# Patient Record
Sex: Female | Born: 1985 | Race: White | Hispanic: Yes | Marital: Married | Smoking: Never smoker
Health system: Southern US, Community
[De-identification: ages and names within clinical notes are randomized; demographics above are authoritative.]

## PROBLEM LIST (undated history)

## (undated) DIAGNOSIS — B999 Unspecified infectious disease: Secondary | ICD-10-CM

## (undated) DIAGNOSIS — A159 Respiratory tuberculosis unspecified: Secondary | ICD-10-CM

## (undated) HISTORY — PX: NO PAST SURGERIES: SHX2092

---

## 2013-09-15 NOTE — L&D Delivery Note (Signed)
Delivery Note At 6:18 PM a viable and healthy female was delivered via Vaginal, Spontaneous Delivery (Presentation:cephalic;left occipital anterior).  APGAR:9,9; weight - pending .   Placenta status:intact; shultz.  Cord: 3 vessel with the following complications: none.    Anesthesia: none   Episiotomy: none  Lacerations: none  Suture Repair: N/A Est. Blood Loss (mL): 200 Mom and baby doing well, bonding FOB at bedside Mom to postpartum.  Baby to Couplet care / Skin to Skin.  ADAMS,SHNIQUAL SHWON 08/18/2014, 6:39 PM  I have seen and examined this patient and I agree with the above. Cam HaiSHAW, KIMBERLY CNM 11:04 PM 08/18/2014

## 2014-02-08 ENCOUNTER — Encounter (HOSPITAL_COMMUNITY): Payer: Self-pay | Admitting: Emergency Medicine

## 2014-02-08 ENCOUNTER — Emergency Department (HOSPITAL_COMMUNITY)
Admission: EM | Admit: 2014-02-08 | Discharge: 2014-02-08 | Disposition: A | Discharge status: Home / Self Care | Payer: Medicaid Other | Attending: Emergency Medicine | Admitting: Emergency Medicine

## 2014-02-08 DIAGNOSIS — A499 Bacterial infection, unspecified: Secondary | ICD-10-CM | POA: Insufficient documentation

## 2014-02-08 DIAGNOSIS — O2390 Unspecified genitourinary tract infection in pregnancy, unspecified trimester: Secondary | ICD-10-CM | POA: Insufficient documentation

## 2014-02-08 DIAGNOSIS — B9689 Other specified bacterial agents as the cause of diseases classified elsewhere: Secondary | ICD-10-CM | POA: Insufficient documentation

## 2014-02-08 DIAGNOSIS — N760 Acute vaginitis: Secondary | ICD-10-CM | POA: Insufficient documentation

## 2014-02-08 DIAGNOSIS — O200 Threatened abortion: Secondary | ICD-10-CM

## 2014-02-08 LAB — CBC WITH DIFFERENTIAL/PLATELET
Basophils Absolute: 0 10*3/uL (ref 0.0–0.1)
Basophils Relative: 0 % (ref 0–1)
Eosinophils Absolute: 0.3 10*3/uL (ref 0.0–0.7)
Eosinophils Relative: 3 % (ref 0–5)
HCT: 34.3 % — ABNORMAL LOW (ref 36.0–46.0)
HEMOGLOBIN: 11.5 g/dL — AB (ref 12.0–15.0)
LYMPHS ABS: 2.3 10*3/uL (ref 0.7–4.0)
LYMPHS PCT: 25 % (ref 12–46)
MCH: 28.3 pg (ref 26.0–34.0)
MCHC: 33.5 g/dL (ref 30.0–36.0)
MCV: 84.5 fL (ref 78.0–100.0)
MONOS PCT: 6 % (ref 3–12)
Monocytes Absolute: 0.6 10*3/uL (ref 0.1–1.0)
NEUTROS ABS: 6.2 10*3/uL (ref 1.7–7.7)
NEUTROS PCT: 66 % (ref 43–77)
Platelets: 273 10*3/uL (ref 150–400)
RBC: 4.06 MIL/uL (ref 3.87–5.11)
RDW: 14.4 % (ref 11.5–15.5)
WBC: 9.4 10*3/uL (ref 4.0–10.5)

## 2014-02-08 LAB — COMPREHENSIVE METABOLIC PANEL
ALK PHOS: 58 U/L (ref 39–117)
ALT: 18 U/L (ref 0–35)
AST: 13 U/L (ref 0–37)
Albumin: 3.6 g/dL (ref 3.5–5.2)
BUN: 12 mg/dL (ref 6–23)
CO2: 23 meq/L (ref 19–32)
Calcium: 9.5 mg/dL (ref 8.4–10.5)
Chloride: 100 mEq/L (ref 96–112)
Creatinine, Ser: 0.56 mg/dL (ref 0.50–1.10)
GFR calc Af Amer: >90 mL/min (ref >90)
GFR calc non Af Amer: >90 mL/min (ref >90)
GLUCOSE: 99 mg/dL (ref 70–99)
POTASSIUM: 3.9 meq/L (ref 3.7–5.3)
Sodium: 138 mEq/L (ref 137–147)
Total Bilirubin: <0.2 mg/dL — ABNORMAL LOW (ref 0.3–1.2)
Total Protein: 7.5 g/dL (ref 6.0–8.3)

## 2014-02-08 LAB — ABO/RH: ABO/RH(D): A POS

## 2014-02-08 LAB — WET PREP, GENITAL
Trich, Wet Prep: NONE SEEN
Yeast Wet Prep HPF POC: NONE SEEN

## 2014-02-08 LAB — HCG, QUANTITATIVE, PREGNANCY: HCG, BETA CHAIN, QUANT, S: 53456 m[IU]/mL — AB (ref <5)

## 2014-02-08 MED ORDER — METRONIDAZOLE 500 MG PO TABS
500.0000 mg | ORAL_TABLET | Freq: Once | ORAL | Status: AC
  Administered 2014-02-08: 500 mg via ORAL
  Filled 2014-02-08: qty 1

## 2014-02-08 MED ORDER — LIDOCAINE HCL (PF) 1 % IJ SOLN
INTRAMUSCULAR | Status: AC
  Administered 2014-02-08: 5 mL
  Filled 2014-02-08: qty 5

## 2014-02-08 MED ORDER — METRONIDAZOLE 500 MG PO TABS: 500.0000 mg | ORAL_TABLET | Freq: Two times a day (BID) | ORAL | Status: DC

## 2014-02-08 MED ORDER — AZITHROMYCIN 250 MG PO TABS
1000.0000 mg | ORAL_TABLET | Freq: Once | ORAL | Status: AC
  Administered 2014-02-08: 1000 mg via ORAL
  Filled 2014-02-08: qty 4

## 2014-02-08 MED ORDER — CEFTRIAXONE SODIUM 250 MG IJ SOLR
250.0000 mg | Freq: Once | INTRAMUSCULAR | Status: AC
  Administered 2014-02-08: 250 mg via INTRAMUSCULAR
  Filled 2014-02-08: qty 250

## 2014-02-08 NOTE — ED Provider Notes (Signed)
Rh positive will not need RhoGAM will be discharged home per Zigmund Gottron -previous instructions  Arman Filter, NP 02/08/14 2139

## 2014-02-08 NOTE — ED Notes (Signed)
Pt reports that she is about [redacted] weeks pregnant and yesterday noticed some bright red blood. Reports pain around her hips and lower abd. Reports some nausea.

## 2014-02-08 NOTE — ED Notes (Signed)
Shelia from the lab called and notified to cancel UA

## 2014-02-08 NOTE — ED Notes (Signed)
Patient G3P2

## 2014-02-08 NOTE — Discharge Instructions (Signed)
Please follow up with your OBGYN or [facility] for further management of your pregnancy. Take antibiotic as prescribed for the full duration.  Return to ER if you have any concerns. Follow instruction below.    Amenaza de aborto (Threatened Miscarriage) La hemorragia en las primeras 20 semanas de embarazo es algo frecuente. Se denomina amenaza de aborto Es un problema del embarazo que ocurre antes de la vigsima semana y que sugiere la probabilidad de que ocurra un aborto espontneo. Generalmente esta hemorragia se detiene con reposo o con disminucin de las actividades, segn le ha sugerido el profesional que la [place], y Firefighter contina sin [employer]. Le indicarn que no [employer], no tenga orgasmos ni use tampones hasta que la autoricen. En algunos casos la amenaza de aborto progresar hasta el aborto completo o incompleto. En algunos casos puede ser necesario un tratamiento adicional, en otros casos no. Algunos abortos ocurren antes que la mujer note que no ha [employer] y de que sepa que est embarazada. Un aborto ocurre en el 15% al 20% de todos los embarazos y generalmente durante las primeras 13 semanas. En la [employer] se desconoce la causa exacta. Es Biochemist, clinical en que la naturaliza pone fin a un embarazo anormal o que no llegar a trmino. Algunas de las cosas que ponen en riesgo el embarazo son:  Los cambios hormonales.  Infeccin o tumores en el tero.  Enfermedades crnicas, por ejemplo la diabetes, especialmente si no se ha controlado.  Forma anormal del tero.  Fibromas en el tero  Crvix incompetente (es demasiado dbil para contener al beb)  El consumo de cigarrillos.  Beber alcohol en exceso. Lo mejor es abstenerse de beber alcohol durante el embarazo.  El consumo de drogas TRATAMIENTO No es necesario Education officer, environmental un tratamiento adicional cuando el aborto es completo y todos los componentes de la concepcin (todos los  tejidos) se han eliminado. Si ha eliminado tejidos, consrvelos en un recipiente y llvelos al mdico para que los evale. Si el aborto es incompleto (partes del feto o de la placenta [place] [employer]ung) ser necesario un tratamiento adicional. La razn ms frecuente para Education officer, environmental un tratamiento es el sangrado (hemorragia) continuo debido a que los tejidos no se han eliminado completamente. Esto sucede cuando el aborto es incompleto. Tambin puede [employer] tejidos que no se han expulsado se infecten. El tratamiento consiste en la dilatacin y Scientific laboratory technician (remocin de los productos del embarazo que pudieran quedar en el tero). Se realizar simplemente por medio de la succin (curetaje por succin) o por un raspado simple del interior del tero. Podr llevarse a cabo en el hospital o en el consultorio del profesional. Slo se lleva a cabo cuando el profesional se asegura que no hay posibilidades de que el embarazo llegue a trmino. Esto se determina por medio del examen fsico, la prueba de Psychiatrist negativa, el recuento hormonal y el ultrasonido que confirme la muerte del feto. El aborto generalmente es una situacin emocionalmente difcil para los [place]. No es por su culpa ni la de su pareja. No ocurre por conductas inapropiadas por parte suya o de su compaero. Casi todos los abortos se producen porque el embarazo ha comenzado de un modo incorrecto. Al menos la mitad de estos embarazos presenta anormalidades cromosmicas. Casi nunca se trata de un trastorno congnito. En otros puede haber problemas de desarrollo en el feto o en la placenta. Esto no siempre se revela, an cuando se estudien los productos  del aborto con el microscopio. No se sienta culpable y probablemente no podra haber evitado que esto ocurra. Si tiene Delta Air Linesproblemas emocionales debido a este problema, convrselo con el profesional y solicite ayuda psicolgico antes de un nuevo Psychiatristembarazo. Casi siempre puede tratar de embarazarse nuevamente tan pronto  el profesional la autorice. INSTRUCCIONES PARA EL CUIDADO DOMICILIARIO  El Economistprofesional le indicar reposo, segn la importancia de la hemorragia y los dolores que Malcolmpresente. Probablemente slo la autorice a levantarse para ir al bao. Tambin podr autorizarla a Building surveyorrealizar una actividad ligera. En este momento usted Economistnecesitar hacer algunos arreglos para que otra persona se ocupe del cuidado de los nios y de otras responsabilidades adicionales.  Lleve un registro de la cantidad y la saturacin de las toallas higinicas que Landscape architectutiliza cada da. Anote esta informacin.  NO USE TAMPONES. No se haga duchas vaginales ni tenga relaciones sexuales u orgasmos hasta que el mdico la autorice.  Puede ser que le indiquen una cita para un seguimiento en el que volvern a Development worker, communityevaluar el [place]estado de su Psychiatristembarazo y Chief Executive Officerle repetirn las pruebas de Norrissangre. Concurra para una nueva evaluacin dentro de 2 das y Richardmouthnuevamente dentro de 4 a 6 semanas. Es muy importante que realice el seguimiento en el momento que le han indicado.  Si su grupo sanguneo es Rh negativo y el del padre es Rh positivo, o si no conoce el grupo sanguneo del padre, le indicarn una inyeccin (inmunoglobulina Rh) para prevenir los anticuerpos anormales que pueden desarrollarse y Audiological scientistafectar al beb en futuros embarazos. SOLICITE ATENCIN MDICA DE INMEDIATO SI:  Siente calambres intensos en el estmago, en la espalda o en el abdomen.  Siente un dolor intenso de comienzo sbito en la zona inferior del abdomen.  Comienza a sentir escalofros.  La temperatura se eleva por encima de 101 F (38.3 C).  Elimina cogulos o tejidos grandes. Guarde una muestra de esos tejidos para que el profesional lo inspeccione.  La hemorragia aumenta o se siente mareada, dbil o tiene episodios de desmayos.  Tiene una prdida de lquido por la vagina.  Se desmaya. No puede mover el intestino. Podra tratarse de un embarazo ectpico. Document Released: 06/11/2005 Document  Revised: 11/24/2011 [facility] Patient Information 2014 [place], [place].  Vaginosis bacteriana (Bacterial Vaginosis) La vaginosis bacteriana es una infeccin vaginal que perturba el equilibrio normal de las bacterias que se encuentran en la vagina. Es el resultado de un crecimiento excesivo de ciertas bacterias. Esta es la infeccin vaginal ms frecuente en mujeres en edad reproductiva. El tratamiento es importante para prevenir complicaciones, especialmente en mujeres embarazadas, dado que puede causar un parto prematuro. CAUSAS  La vaginosis bacteriana se origina por un aumento de bacterias nocivas que, generalmente, estn presentes en cantidades ms pequeas en la vagina. Varios tipos diferentes de bacterias pueden causar esta afeccin. Sin embargo, la causa de su desarrollo no se comprende totalmente. FACTORES DE RIESGO Ciertas actividades o comportamientos pueden exponerlo a un mayor riesgo de desarrollar vaginosis bacteriana, entre los que se incluyen:  Tener una nueva pareja sexual o mltiples parejas sexuales.  Las duchas vaginales  El uso del DIU (dispositivo intrauterino) como mtodo anticonceptivo. El contagio no se produce en baos, por ropas de cama, en piscinas o por contacto con objetos. SIGNOS Y SNTOMAS  Algunas mujeres que padecen vaginosis bacteriana no presentan signos ni sntomas. Los sntomas ms comunes son:  Secrecin vaginal de color grisceo.  Secrecin vaginal con olor similar al Wal-Martpescado, especialmente despus de Sales promotion account executivemantener relaciones sexuales.  Picazn o  sensacin de ardor en la vagina o la vulva.  Ardor o dolor al [employer]illips. DIAGNSTICO  Su mdico analizar su historia clnica y le examinar la vagina para detectar signos de vaginosis bacteriana. Puede tomarle Lauris Poag de flujo vaginal. Su mdico examinar esta muestra con un microscopio para controlar las bacterias y clulas anormales. Tambin puede realizarse un anlisis del pH vaginal.  TRATAMIENTO  La  vaginosis bacteriana puede tratarse con antibiticos, en forma de comprimidos o de crema vaginal. Puede indicarse una segunda tanda de antibiticos si la afeccin se repite despus del tratamiento.  INSTRUCCIONES PARA EL CUIDADO EN EL HOGAR   Tome solo medicamentos de venta libre o recetados, segn las indicaciones del mdico.  Si le han recetado antibiticos, tmelos como se le indic. Asegrese de que finaliza la prescripcin completa aunque se sienta mejor.  No mantenga relaciones sexuales Librarian, academic.  Comunique a sus compaeros sexuales que sufre una infeccin vaginal. Deben consultar a su mdico y recibir tratamiento si tienen problemas, como picazn o una erupcin cutnea leve.  Practique el sexo seguro usando preservativos y tenga un nico compaero sexual. SOLICITE ATENCIN MDICA SI:   Sus sntomas no mejoran despus de 3 das de [place].  Aumenta la secrecin o Chief Technology Officer.  Tiene fiebre. ASEGRESE DE QUE:   Comprende estas instrucciones.  Controlar su afeccin.  Recibir ayuda de inmediato si no mejora o si empeora. PARA OBTENER MS INFORMACIN  Centros para el control y la prevencin de Child psychotherapist for Disease Control and Prevention, CDC): SolutionApps.co.za Asociacin Estadounidense de la Salud Sexual (American Sexual Health Association, SHA): www.ashastd.org  Document Released: 12/09/2007 Document Revised: 06/22/2013 [facility] Patient Information 2014 [place], [place].

## 2014-02-08 NOTE — ED Provider Notes (Signed)
CSN: [id number]     Arrival date & time 02/08/14  1711 History   First MD Initiated Contact with Patient 02/08/14 1852     Chief Complaint  Patient presents with  . Abdominal Pain     (Consider location/radiation/quality/duration/timing/severity/associated sxs/prior Treatment) HPI  28 year old G3 P2 female who reportedly [redacted] weeks pregnant presents complaining of vaginal bleeding. Patient report yesterday she developed gradual onset of low pelvic pain. She had a bowel movement and subsequently noticed mild vaginal bleeding when wiped. Patient continues to endorse stabbing low abdominal pain throughout today and states she hasn't notice any fetal movement. No vaginal bleeding today.  She reports mild nausea only. Denies any fever, chills, chest pain, shortness of breath, back pain, dysuria, hematuria, vaginal discharge, or rash. She denies any recent trauma. She has 2 normal vaginal birth with healthy kids. Her last menstrual period was March 16. She has not had an official ultrasound has been seen by an OB/GYN yet   History reviewed. No pertinent past medical history. History reviewed. No pertinent past surgical history. History reviewed. No pertinent family history. History  Substance Use Topics  . Smoking status: Never Smoker   . Smokeless tobacco: Not on file  . Alcohol Use: No   OB History   Grav Para Term Preterm Abortions TAB SAB Ect Mult Living   1              Review of Systems  Constitutional: Negative for fever.  Gastrointestinal: Positive for abdominal pain.  Genitourinary: Positive for vaginal bleeding. Negative for flank pain.  All other systems reviewed and are negative.     Allergies  Review of patient's allergies indicates no known allergies.  Home Medications   Prior to Admission medications   Not on File   BP 126/68  Pulse 69  Temp(Src) 98.8 F (37.1 C) (Oral)  Resp 20  SpO2 99%  LMP 11/28/2013 Physical Exam  Nursing note and vitals  reviewed. Constitutional: She appears well-developed and well-nourished. No distress.  HENT:  Head: Normocephalic and atraumatic.  Eyes: Conjunctivae are normal.  Neck: Normal range of motion. Neck supple.  Cardiovascular: Normal rate and regular rhythm.   Pulmonary/Chest: Effort normal and breath sounds normal. She exhibits no tenderness.  Abdominal: Soft. There is no tenderness (mild suprapubic tenderness without guarding or rebound tenderness.).  Genitourinary: Uterus normal. There is no rash or lesion on the right labia. There is no rash or lesion on the left labia. Cervix exhibits no motion tenderness and no discharge. Right adnexum displays no mass. Left adnexum displays no mass. There is bleeding around the vagina. No erythema or tenderness around the vagina. No vaginal discharge found.  Chaperone present:  Patient has normal external genitalia, no evidence of lymphadenopathy. Normal labia majora, normal labia minora, vaginal vault without active bleeding or discharge. Cervical os visualized and is closed. 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 Minimal tenderness to both left and right adnexal without cervical motion tenderness.  Lymphadenopathy:       Right: No inguinal adenopathy present.       Left: No inguinal adenopathy present.    ED Course  Procedures (including critical care time)  Pt here with vaginal bleeding, currently [redacted] weeks pregnant.  On pelvic exam pt has friable tissue at cervical os but os is closed.  Bleeding likely from these friable tissue.  On bedside US performed by me and Dr. Silverio Lay we  can visualized an IUP with normal fetal heart activity.  Given her bleeding which is concerning for threatening miscarriage, [facility] or with her [facility]BGYN for further evaluation.  I will also check ABO/Rh to determine if she needs RhoGam.  Pt does have moderate clue cells and strong odor with  vaginal discharge on exam, i will give flagyl as treatment.    Pt does have many WBC on wet prep, will await GC/Ch cultures to determine treatment.  Care discussed with oncoming provider who will d/c pt pending lab results.    Labs Review Labs Reviewed  WET PREP, GENITAL - Abnormal; Notable for the following:    Clue Cells Wet Prep HPF POC MODERATE (*)    WBC, Wet Prep HPF POC MANY (*)    All other components within normal limits  CBC WITH DIFFERENTIAL - Abnormal; Notable for the following:    Hemoglobin 11.5 (*)    HCT 34.3 (*)    All other components within normal limits  COMPREHENSIVE METABOLIC PANEL - Abnormal; Notable for the following:    Total Bilirubin <0.2 (*)    All other components within normal limits  HCG, QUANTITATIVE, PREGNANCY - Abnormal; Notable for the following:    hCG, Beta Chain, Quant, S 53456 (*)    All other components within normal limits  GC/CHLAMYDIA PROBE AMP  HIV ANTIBODY (ROUTINE TESTING)  ABO/RH    Imaging Review No results found.   EKG Interpretation None      MDM   Final diagnoses:  Threatened miscarriage in early pregnancy  BV (bacterial vaginosis)    BP 108/67  Pulse 71  Temp(Src) 97.8 F (36.6 C) (Oral)  Resp 16  SpO2 99%  LMP 11/28/2013     Fayrene HelperBowie Candies Palm, PA-C 02/09/14 1058

## 2014-02-08 NOTE — ED Notes (Signed)
Phlebotomy at bedside.

## 2014-02-09 LAB — GC/CHLAMYDIA PROBE AMP
CT PROBE, AMP APTIMA: NEGATIVE
GC Probe RNA: NEGATIVE

## 2014-02-09 LAB — HIV ANTIBODY (ROUTINE TESTING W REFLEX): HIV 1&2 Ab, 4th Generation: NONREACTIVE

## 2014-02-09 NOTE — ED Provider Notes (Signed)
Medical screening examination/treatment/procedure(s) were conducted as a shared visit with non-physician practitioner(s) and myself.  I personally evaluated the patient during the encounter.   EKG Interpretation None      See my separate note   Richardean Canal, MD 02/09/14 [phone]

## 2014-02-09 NOTE — ED Provider Notes (Signed)
Medical screening examination/treatment/procedure(s) were conducted as a shared visit with non-physician practitioner(s) and myself.  I personally evaluated the patient during the encounter.   EKG Interpretation None      Hannah [name] is a 28 y.o. female [redacted] weeks pregnant by dates here with vaginal bleeding. Lower pelvic pain and some spotting. PA performed vaginal exam showed small spotting. I performed US that showed nl IUP, rate 180s, 12 weeks by date. Labs unremarkable. Recommend [facility]. Likely threatened abortion.   EMERGENCY DEPARTMENT [place] PREGNANCY "Study: Limited Ultrasound of the Pelvis"  INDICATIONS:Pregnancy(required) and Vaginal bleeding Multiple views of the uterus and pelvic cavity are obtained with a multi-frequency probe.  APPROACH:Transabdominal   PERFORMED BY: Myself  IMAGES ARCHIVED?: Yes  LIMITATIONS: Emergent procedure  PREGNANCY FREE FLUID: None  PREGNANCY UTERUS FINDINGS:Uterus enlarged and Gestational sac noted ADNEXAL FINDINGS:Left ovary not seen and Right ovary not seen  PREGNANCY FINDINGS: Intrauterine gestational sac noted, Yolk sac noted, Fetal pole present and Fetal heart activity seen  INTERPRETATION: Viable intrauterine pregnancy  GESTATIONAL AGE, ESTIMATE: 12 weeks  FETAL HEART RATE: 188  COMMENT(Estimate of Gestational Age):  Nl first trimester pregnancy       Richardean Canal, MD 02/09/14 1500

## 2014-02-16 LAB — OB RESULTS CONSOLE GBS: STREP GROUP B AG: POSITIVE

## 2014-02-20 ENCOUNTER — Other Ambulatory Visit (HOSPITAL_COMMUNITY): Payer: Self-pay | Admitting: Physician Assistant

## 2014-02-20 DIAGNOSIS — O3680X0 Pregnancy with inconclusive fetal viability, not applicable or unspecified: Secondary | ICD-10-CM

## 2014-02-20 DIAGNOSIS — Z3689 Encounter for other specified antenatal screening: Secondary | ICD-10-CM

## 2014-02-20 LAB — OB RESULTS CONSOLE HIV ANTIBODY (ROUTINE TESTING): HIV: NONREACTIVE

## 2014-02-20 LAB — OB RESULTS CONSOLE GC/CHLAMYDIA
Chlamydia: NEGATIVE
Gonorrhea: NEGATIVE

## 2014-02-20 LAB — OB RESULTS CONSOLE RPR: RPR: NONREACTIVE

## 2014-02-20 LAB — OB RESULTS CONSOLE RUBELLA ANTIBODY, IGM: RUBELLA: IMMUNE

## 2014-02-20 LAB — OB RESULTS CONSOLE HEPATITIS B SURFACE ANTIGEN: Hepatitis B Surface Ag: NEGATIVE

## 2014-02-21 ENCOUNTER — Ambulatory Visit (HOSPITAL_COMMUNITY)
Admission: RE | Admit: 2014-02-21 | Discharge: 2014-02-21 | Disposition: A | Discharge status: Home / Self Care | Payer: Medicaid Other | Source: Ambulatory Visit | Attending: Physician Assistant | Admitting: Physician Assistant

## 2014-02-21 ENCOUNTER — Other Ambulatory Visit (HOSPITAL_COMMUNITY): Payer: Self-pay | Admitting: Physician Assistant

## 2014-02-21 DIAGNOSIS — Z3689 Encounter for other specified antenatal screening: Secondary | ICD-10-CM | POA: Insufficient documentation

## 2014-02-21 DIAGNOSIS — O3680X0 Pregnancy with inconclusive fetal viability, not applicable or unspecified: Secondary | ICD-10-CM | POA: Insufficient documentation

## 2014-03-22 ENCOUNTER — Ambulatory Visit (HOSPITAL_COMMUNITY)
Admission: RE | Admit: 2014-03-22 | Discharge: 2014-03-22 | Disposition: A | Discharge status: Home / Self Care | Payer: Medicaid Other | Source: Ambulatory Visit | Attending: Physician Assistant | Admitting: Physician Assistant

## 2014-03-22 DIAGNOSIS — Z3689 Encounter for other specified antenatal screening: Secondary | ICD-10-CM | POA: Diagnosis not present

## 2014-03-27 ENCOUNTER — Other Ambulatory Visit (HOSPITAL_COMMUNITY): Payer: Self-pay | Admitting: Nurse Practitioner

## 2014-03-27 DIAGNOSIS — IMO0002 Reserved for concepts with insufficient information to code with codable children: Secondary | ICD-10-CM

## 2014-03-27 DIAGNOSIS — Z0489 Encounter for examination and observation for other specified reasons: Secondary | ICD-10-CM

## 2014-04-19 ENCOUNTER — Ambulatory Visit (HOSPITAL_COMMUNITY)
Admission: RE | Admit: 2014-04-19 | Discharge: 2014-04-19 | Disposition: A | Discharge status: Home / Self Care | Payer: Medicaid Other | Source: Ambulatory Visit | Attending: Nurse Practitioner | Admitting: Nurse Practitioner

## 2014-04-19 DIAGNOSIS — Z0489 Encounter for examination and observation for other specified reasons: Secondary | ICD-10-CM

## 2014-04-19 DIAGNOSIS — Z1389 Encounter for screening for other disorder: Secondary | ICD-10-CM | POA: Diagnosis not present

## 2014-04-19 DIAGNOSIS — Z363 Encounter for antenatal screening for malformations: Secondary | ICD-10-CM | POA: Insufficient documentation

## 2014-04-19 DIAGNOSIS — IMO0002 Reserved for concepts with insufficient information to code with codable children: Secondary | ICD-10-CM

## 2014-05-31 ENCOUNTER — Encounter: Payer: Self-pay | Admitting: *Deleted

## 2014-07-17 ENCOUNTER — Encounter (HOSPITAL_COMMUNITY): Payer: Self-pay | Admitting: Emergency Medicine

## 2014-07-24 ENCOUNTER — Other Ambulatory Visit (HOSPITAL_COMMUNITY): Payer: Self-pay | Admitting: Physician Assistant

## 2014-07-24 DIAGNOSIS — O321XX1 Maternal care for breech presentation, fetus 1: Secondary | ICD-10-CM

## 2014-07-25 ENCOUNTER — Ambulatory Visit (HOSPITAL_COMMUNITY)
Admission: RE | Admit: 2014-07-25 | Discharge: 2014-07-25 | Disposition: A | Discharge status: Home / Self Care | Payer: Medicaid Other | Source: Ambulatory Visit | Attending: Physician Assistant | Admitting: Physician Assistant

## 2014-07-25 DIAGNOSIS — Z3A36 36 weeks gestation of pregnancy: Secondary | ICD-10-CM | POA: Diagnosis not present

## 2014-07-25 DIAGNOSIS — Z36 Encounter for antenatal screening of mother: Secondary | ICD-10-CM | POA: Insufficient documentation

## 2014-07-25 DIAGNOSIS — O321XX1 Maternal care for breech presentation, fetus 1: Secondary | ICD-10-CM

## 2014-07-25 DIAGNOSIS — Z3689 Encounter for other specified antenatal screening: Secondary | ICD-10-CM | POA: Insufficient documentation

## 2014-08-18 ENCOUNTER — Encounter (HOSPITAL_COMMUNITY): Payer: Self-pay | Admitting: *Deleted

## 2014-08-18 ENCOUNTER — Inpatient Hospital Stay (HOSPITAL_COMMUNITY): Payer: Medicaid Other

## 2014-08-18 ENCOUNTER — Inpatient Hospital Stay (HOSPITAL_COMMUNITY)
Admission: AD | Admit: 2014-08-18 | Discharge: 2014-08-20 | DRG: 767 | Disposition: A | Discharge status: Home / Self Care | Payer: Medicaid Other | Source: Ambulatory Visit | Attending: Obstetrics and Gynecology | Admitting: Obstetrics and Gynecology

## 2014-08-18 DIAGNOSIS — O99824 Streptococcus B carrier state complicating childbirth: Secondary | ICD-10-CM | POA: Diagnosis present

## 2014-08-18 DIAGNOSIS — Z3A39 39 weeks gestation of pregnancy: Secondary | ICD-10-CM | POA: Diagnosis present

## 2014-08-18 DIAGNOSIS — Z201 Contact with and (suspected) exposure to tuberculosis: Secondary | ICD-10-CM

## 2014-08-18 DIAGNOSIS — Z302 Encounter for sterilization: Secondary | ICD-10-CM | POA: Diagnosis not present

## 2014-08-18 DIAGNOSIS — IMO0001 Reserved for inherently not codable concepts without codable children: Secondary | ICD-10-CM

## 2014-08-18 HISTORY — DX: Respiratory tuberculosis unspecified: A15.9

## 2014-08-18 HISTORY — DX: Unspecified infectious disease: B99.9

## 2014-08-18 LAB — CBC
HEMATOCRIT: 38.2 % (ref 36.0–46.0)
Hemoglobin: 12.6 g/dL (ref 12.0–15.0)
MCH: 28.6 pg (ref 26.0–34.0)
MCHC: 33 g/dL (ref 30.0–36.0)
MCV: 86.6 fL (ref 78.0–100.0)
Platelets: 242 10*3/uL (ref 150–400)
RBC: 4.41 MIL/uL (ref 3.87–5.11)
RDW: 15.4 % (ref 11.5–15.5)
WBC: 9.2 10*3/uL (ref 4.0–10.5)

## 2014-08-18 LAB — TYPE AND SCREEN
ABO/RH(D): A POS
ANTIBODY SCREEN: NEGATIVE

## 2014-08-18 LAB — RPR

## 2014-08-18 LAB — ABO/RH: ABO/RH(D): A POS

## 2014-08-18 LAB — POCT FERN TEST: POCT Fern Test: NEGATIVE

## 2014-08-18 LAB — HIV ANTIBODY (ROUTINE TESTING W REFLEX): HIV 1&2 Ab, 4th Generation: NONREACTIVE

## 2014-08-18 MED ORDER — LANOLIN HYDROUS EX OINT: TOPICAL_OINTMENT | CUTANEOUS | Status: DC | PRN

## 2014-08-18 MED ORDER — TETANUS-DIPHTH-ACELL PERTUSSIS 5-2.5-18.5 LF-MCG/0.5 IM SUSP: 0.5000 mL | Freq: Once | INTRAMUSCULAR | Status: DC

## 2014-08-18 MED ORDER — EPHEDRINE 5 MG/ML INJ
10.0000 mg | INTRAVENOUS | Status: DC | PRN
Start: 2014-08-18 — End: 2014-08-18
  Filled 2014-08-18: qty 2

## 2014-08-18 MED ORDER — OXYCODONE-ACETAMINOPHEN 5-325 MG PO TABS: 2.0000 | ORAL_TABLET | ORAL | Status: DC | PRN

## 2014-08-18 MED ORDER — OXYCODONE-ACETAMINOPHEN 5-325 MG PO TABS: 1.0000 | ORAL_TABLET | ORAL | Status: DC | PRN

## 2014-08-18 MED ORDER — OXYTOCIN 40 UNITS IN LACTATED RINGERS INFUSION - SIMPLE MED
62.5000 mL/h | INTRAVENOUS | Status: DC
  Administered 2014-08-18: 62.5 mL/h via INTRAVENOUS

## 2014-08-18 MED ORDER — DIPHENHYDRAMINE HCL 25 MG PO CAPS: 25.0000 mg | ORAL_CAPSULE | Freq: Four times a day (QID) | ORAL | Status: DC | PRN

## 2014-08-18 MED ORDER — FENTANYL 2.5 MCG/ML BUPIVACAINE 1/10 % EPIDURAL INFUSION (WH - ANES): 14.0000 mL/h | INTRAMUSCULAR | Status: DC | PRN

## 2014-08-18 MED ORDER — FENTANYL CITRATE 0.05 MG/ML IJ SOLN: 100.0000 ug | INTRAMUSCULAR | Status: DC | PRN

## 2014-08-18 MED ORDER — SODIUM CHLORIDE 0.9 % IV SOLN
2.0000 g | Freq: Four times a day (QID) | INTRAVENOUS | Status: DC
  Administered 2014-08-18: 2 g via INTRAVENOUS
  Filled 2014-08-18 (×2): qty 2000

## 2014-08-18 MED ORDER — AMPICILLIN SODIUM 2 G IJ SOLR
2.0000 g | Freq: Once | INTRAMUSCULAR | Status: AC
  Administered 2014-08-18: 2 g via INTRAVENOUS
  Filled 2014-08-18 (×2): qty 2000

## 2014-08-18 MED ORDER — LACTATED RINGERS IV SOLN: 500.0000 mL | Freq: Once | INTRAVENOUS | Status: DC

## 2014-08-18 MED ORDER — BENZOCAINE-MENTHOL 20-0.5 % EX AERO: 1.0000 "application " | INHALATION_SPRAY | CUTANEOUS | Status: DC | PRN

## 2014-08-18 MED ORDER — LACTATED RINGERS IV SOLN: 500.0000 mL | INTRAVENOUS | Status: DC | PRN

## 2014-08-18 MED ORDER — SENNOSIDES-DOCUSATE SODIUM 8.6-50 MG PO TABS
2.0000 | ORAL_TABLET | ORAL | Status: DC
  Administered 2014-08-19 – 2014-08-20 (×2): 2 via ORAL

## 2014-08-18 MED ORDER — PHENYLEPHRINE 40 MCG/ML (10ML) SYRINGE FOR IV PUSH (FOR BLOOD PRESSURE SUPPORT)
80.0000 ug | PREFILLED_SYRINGE | INTRAVENOUS | Status: DC | PRN
Start: 2014-08-18 — End: 2014-08-18
  Filled 2014-08-18: qty 2

## 2014-08-18 MED ORDER — DIBUCAINE 1 % RE OINT: 1.0000 "application " | TOPICAL_OINTMENT | RECTAL | Status: DC | PRN

## 2014-08-18 MED ORDER — OXYTOCIN BOLUS FROM INFUSION: 500.0000 mL | INTRAVENOUS | Status: DC

## 2014-08-18 MED ORDER — DIPHENHYDRAMINE HCL 50 MG/ML IJ SOLN: 12.5000 mg | INTRAMUSCULAR | Status: DC | PRN

## 2014-08-18 MED ORDER — SIMETHICONE 80 MG PO CHEW: 80.0000 mg | CHEWABLE_TABLET | ORAL | Status: DC | PRN

## 2014-08-18 MED ORDER — TERBUTALINE SULFATE 1 MG/ML IJ SOLN: 0.2500 mg | Freq: Once | INTRAMUSCULAR | Status: DC | PRN

## 2014-08-18 MED ORDER — IBUPROFEN 600 MG PO TABS
600.0000 mg | ORAL_TABLET | Freq: Four times a day (QID) | ORAL | Status: DC
  Administered 2014-08-18 – 2014-08-20 (×6): 600 mg via ORAL
  Filled 2014-08-18 (×3): qty 1

## 2014-08-18 MED ORDER — EPHEDRINE 5 MG/ML INJ
10.0000 mg | INTRAVENOUS | Status: DC | PRN
  Filled 2014-08-18: qty 2

## 2014-08-18 MED ORDER — LIDOCAINE HCL (PF) 1 % IJ SOLN
30.0000 mL | INTRAMUSCULAR | Status: DC | PRN
  Filled 2014-08-18: qty 30

## 2014-08-18 MED ORDER — PRENATAL MULTIVITAMIN CH: 1.0000 | ORAL_TABLET | Freq: Every day | ORAL | Status: DC

## 2014-08-18 MED ORDER — WITCH HAZEL-GLYCERIN EX PADS: 1.0000 "application " | MEDICATED_PAD | CUTANEOUS | Status: DC | PRN

## 2014-08-18 MED ORDER — ZOLPIDEM TARTRATE 5 MG PO TABS: 5.0000 mg | ORAL_TABLET | Freq: Every evening | ORAL | Status: DC | PRN

## 2014-08-18 MED ORDER — OXYCODONE-ACETAMINOPHEN 5-325 MG PO TABS
1.0000 | ORAL_TABLET | ORAL | Status: DC | PRN
  Administered 2014-08-19 – 2014-08-20 (×2): 1 via ORAL
  Filled 2014-08-18 (×2): qty 1

## 2014-08-18 MED ORDER — OXYTOCIN 40 UNITS IN LACTATED RINGERS INFUSION - SIMPLE MED
1.0000 m[IU]/min | INTRAVENOUS | Status: DC
  Administered 2014-08-18: 2 m[IU]/min via INTRAVENOUS

## 2014-08-18 MED ORDER — ONDANSETRON HCL 4 MG/2ML IJ SOLN: 4.0000 mg | Freq: Four times a day (QID) | INTRAMUSCULAR | Status: DC | PRN

## 2014-08-18 MED ORDER — CITRIC ACID-SODIUM CITRATE 334-500 MG/5ML PO SOLN: 30.0000 mL | ORAL | Status: DC | PRN

## 2014-08-18 MED ORDER — LACTATED RINGERS IV SOLN
INTRAVENOUS | Status: DC
  Administered 2014-08-18: 125 mL via INTRAVENOUS

## 2014-08-18 MED ORDER — ONDANSETRON HCL 4 MG/2ML IJ SOLN: 4.0000 mg | INTRAMUSCULAR | Status: DC | PRN

## 2014-08-18 MED ORDER — ONDANSETRON HCL 4 MG PO TABS: 4.0000 mg | ORAL_TABLET | ORAL | Status: DC | PRN

## 2014-08-18 MED ORDER — ACETAMINOPHEN 325 MG PO TABS: 650.0000 mg | ORAL_TABLET | ORAL | Status: DC | PRN

## 2014-08-18 NOTE — Progress Notes (Signed)
Hannah [name] is a 28 y.o. [phone] at 3938w5d admitted for SOL  Subjective: Doing well, some discomfort with contractions.  Objective: BP 140/76 mmHg  Pulse 75  Temp(Src) 98.3 F (36.8 C) (Oral)  Resp 20  Ht 5\' 2"  (1.575 m)  Wt 214 lb (97.07 kg)  BMI 39.13 kg/m2  LMP 11/28/2013    FHT:  FHR: 130 bpm, variability: moderate,  accelerations:  Present,  decelerations:  Absent UC:   Irregular contractions  SVE:   Dilation: 5 Effacement (%): 50 Exam by:: jolynn   Labs: Lab Results  Component Value Date   WBC 9.2 08/18/2014   HGB 12.6 08/18/2014   HCT 38.2 08/18/2014   MCV 86.6 08/18/2014   PLT 242 08/18/2014    Assessment / Plan: Spontaneous labor, progressing normally  AROM - small amount of clear fluids  Labor: Progressing normally Fetal Wellbeing:  Category I Pain Control:  Fentanyl IV Pre-eclampsia: N/A I/D:  N/A Anticipated MOD:  NSVD  Hannah [name] SHWON Student NM 08/18/2014, 2:16 PM

## 2014-08-18 NOTE — MAU Note (Signed)
Has been having contractions off and on, more intense today and this is the first time she had leaking.

## 2014-08-18 NOTE — MAU Note (Addendum)
Contractions started this morning around 5.  Started  Leaking clear fluid, little blood around 6. 4th baby

## 2014-08-18 NOTE — H&P (Signed)
Leland HerSonia SanchezMarquezz is a 28 y.o. female presenting for SOL. History OB History    Gravida Para Term Preterm AB TAB SAB Ectopic Multiple Living   4 3 3  0 0 0 0 0 0 3     Past Medical History  Diagnosis Date  . Infection     UTI in early preg   Past Surgical History  Procedure Laterality Date  . No past surgeries     Family History: family history is negative for Hearing loss. Social History:  reports that she has never smoked. She has never used smokeless tobacco. She reports that she does not drink alcohol or use illicit drugs.   Prenatal Transfer Tool  Maternal Diabetes: No Genetic Screening: Declined Maternal Ultrasounds/Referrals: Normal Fetal Ultrasounds or other Referrals:  None Maternal Substance Abuse:  No Significant Maternal Medications:  None Significant Maternal Lab Results:  None Other Comments:  None  Review of Systems  Constitutional: Negative.   HENT: Negative.   Eyes: Negative.   Respiratory: Negative.   Cardiovascular: Negative.   Gastrointestinal: Negative.   Genitourinary: Negative.   Musculoskeletal: Negative.   Skin: Negative.   Neurological: Negative.   Endo/Heme/Allergies: Negative.   Psychiatric/Behavioral: Negative.     Dilation: 5 Effacement (%): 50 Exam by:: jolynn Blood pressure 130/86, pulse 93, temperature 98.2 F (36.8 C), temperature source Oral, resp. rate 18, height 5\' 2"  (1.575 m), weight 214 lb (97.07 kg), last menstrual period 11/28/2013. Maternal Exam:  Uterine Assessment: Contraction strength is moderate.  Contraction frequency is irregular.   Abdomen: Gravid abdomen  Introitus: Normal vulva. Normal vagina.  Vagina is negative for discharge.  Ferning test: negative.   Cervix: Cervix evaluated by digital exam.     Fetal Exam Fetal Monitor Review: Mode: hand-held doppler probe.   Baseline rate: 140.  Variability: moderate (6-25 bpm).   Pattern: accelerations present and no decelerations.    Fetal State  Assessment: Category I - tracings are normal.     Physical Exam  Constitutional: She is oriented to person, place, and time. She appears well-developed and well-nourished.  HENT:  Head: Normocephalic.  Eyes: Pupils are equal, round, and reactive to light.  Neck: Normal range of motion. Neck supple.  Cardiovascular: Normal rate and regular rhythm.   Respiratory: Effort normal.  GI:  Gravid abdomen  Genitourinary: Vagina normal. No vaginal discharge found.  Musculoskeletal: Normal range of motion.  Neurological: She is alert and oriented to person, place, and time.  Skin: Skin is warm and dry.  Psychiatric: She has a normal mood and affect. Her behavior is normal.    Prenatal labs: ABO, Rh: A positive Antibody:  Neg Rubella:  Immune RPR:  Neg HBsAg:  Neg  HIV: NONREACTIVE (05/27 2042)  GBS:  positive in urine   Assessment/Plan: IUP @ 2147w5d G4P3003 Admit today Expected NSVD   Keayra Graham SHWON 08/18/2014, 9:43 AM

## 2014-08-19 ENCOUNTER — Encounter (HOSPITAL_COMMUNITY)
Admission: AD | Disposition: A | Discharge status: Home / Self Care | Payer: Self-pay | Source: Ambulatory Visit | Attending: Obstetrics and Gynecology

## 2014-08-19 ENCOUNTER — Inpatient Hospital Stay (HOSPITAL_COMMUNITY): Payer: Medicaid Other | Admitting: Anesthesiology

## 2014-08-19 DIAGNOSIS — Z302 Encounter for sterilization: Secondary | ICD-10-CM

## 2014-08-19 HISTORY — PX: TUBAL LIGATION: SHX77

## 2014-08-19 SURGERY — LIGATION, FALLOPIAN TUBE, POSTPARTUM
Anesthesia: Spinal | Site: Abdomen | Laterality: Bilateral

## 2014-08-19 MED ORDER — DEXAMETHASONE SODIUM PHOSPHATE 10 MG/ML IJ SOLN
INTRAMUSCULAR | Status: DC | PRN
  Administered 2014-08-19: 4 mg via INTRAVENOUS

## 2014-08-19 MED ORDER — KETOROLAC TROMETHAMINE 30 MG/ML IJ SOLN
15.0000 mg | Freq: Once | INTRAMUSCULAR | Status: AC | PRN
  Administered 2014-08-19: 30 mg via INTRAVENOUS

## 2014-08-19 MED ORDER — ONDANSETRON HCL 4 MG/2ML IJ SOLN
INTRAMUSCULAR | Status: AC
  Filled 2014-08-19: qty 2

## 2014-08-19 MED ORDER — ONDANSETRON HCL 4 MG/2ML IJ SOLN
INTRAMUSCULAR | Status: DC | PRN
  Administered 2014-08-19: 4 mg via INTRAVENOUS

## 2014-08-19 MED ORDER — FAMOTIDINE 20 MG PO TABS
40.0000 mg | ORAL_TABLET | Freq: Once | ORAL | Status: AC
  Administered 2014-08-19: 40 mg via ORAL

## 2014-08-19 MED ORDER — METOCLOPRAMIDE HCL 10 MG PO TABS
10.0000 mg | ORAL_TABLET | Freq: Once | ORAL | Status: AC
  Administered 2014-08-19: 10 mg via ORAL

## 2014-08-19 MED ORDER — KETOROLAC TROMETHAMINE 30 MG/ML IJ SOLN
INTRAMUSCULAR | Status: AC
  Filled 2014-08-19: qty 1

## 2014-08-19 MED ORDER — DEXAMETHASONE SODIUM PHOSPHATE 4 MG/ML IJ SOLN
INTRAMUSCULAR | Status: AC
  Filled 2014-08-19: qty 1

## 2014-08-19 MED ORDER — PROMETHAZINE HCL 25 MG/ML IJ SOLN: 6.2500 mg | INTRAMUSCULAR | Status: DC | PRN

## 2014-08-19 MED ORDER — MEPERIDINE HCL 25 MG/ML IJ SOLN: 6.2500 mg | INTRAMUSCULAR | Status: DC | PRN

## 2014-08-19 MED ORDER — BUPIVACAINE HCL (PF) 0.25 % IJ SOLN
INTRAMUSCULAR | Status: DC | PRN
  Administered 2014-08-19: 10 mL

## 2014-08-19 MED ORDER — LACTATED RINGERS IV SOLN
INTRAVENOUS | Status: DC
Start: 2014-08-19 — End: 2014-08-20
  Administered 2014-08-19 (×2): via INTRAVENOUS

## 2014-08-19 MED ORDER — BUPIVACAINE IN DEXTROSE 0.75-8.25 % IT SOLN
INTRATHECAL | Status: DC | PRN
  Administered 2014-08-19: 1.2 mL via INTRATHECAL

## 2014-08-19 MED ORDER — FENTANYL CITRATE 0.05 MG/ML IJ SOLN: 25.0000 ug | INTRAMUSCULAR | Status: DC | PRN

## 2014-08-19 SURGICAL SUPPLY — 27 items
CLIP FILSHIE TUBAL LIGA STRL (Clip) ×2 IMPLANT
CLOTH BEACON ORANGE TIMEOUT ST (SAFETY) ×2 IMPLANT
CONTAINER PREFILL 10% NBF 15ML (MISCELLANEOUS) ×4 IMPLANT
DRSG OPSITE POSTOP 3X4 (GAUZE/BANDAGES/DRESSINGS) ×2 IMPLANT
ELECT REM PT RETURN 9FT ADLT (ELECTROSURGICAL) ×2
ELECTRODE REM PT RTRN 9FT ADLT (ELECTROSURGICAL) ×1 IMPLANT
GLOVE BIOGEL PI IND STRL 8 (GLOVE) ×1 IMPLANT
GLOVE BIOGEL PI INDICATOR 8 (GLOVE) ×1
GLOVE ECLIPSE 8.0 STRL XLNG CF (GLOVE) ×2 IMPLANT
GOWN STRL REUS W/TWL LRG LVL3 (GOWN DISPOSABLE) ×4 IMPLANT
LIQUID BAND (GAUZE/BANDAGES/DRESSINGS) ×2 IMPLANT
NEEDLE HYPO 25X1 1.5 SAFETY (NEEDLE) ×2 IMPLANT
NS IRRIG 1000ML POUR BTL (IV SOLUTION) ×2 IMPLANT
PACK ABDOMINAL MINOR (CUSTOM PROCEDURE TRAY) ×2 IMPLANT
PENCIL BUTTON HOLSTER BLD 10FT (ELECTRODE) ×2 IMPLANT
RETRACTOR WOUND ALXS 19CM XSML (INSTRUMENTS) ×1 IMPLANT
RTRCTR WOUND ALEXIS 19CM XSML (INSTRUMENTS) ×2
SPONGE LAP 4X18 X RAY DECT (DISPOSABLE) ×2 IMPLANT
SUT PLAIN 2 0 (SUTURE) ×2
SUT PLAIN ABS 2-0 CT1 27XMFL (SUTURE) ×2 IMPLANT
SUT VIC AB 0 CT1 27 (SUTURE) ×1
SUT VIC AB 0 CT1 27XBRD ANBCTR (SUTURE) ×1 IMPLANT
SUT VIC AB 4-0 KS 27 (SUTURE) ×2 IMPLANT
SYR CONTROL 10ML LL (SYRINGE) ×2 IMPLANT
TOWEL OR 17X24 6PK STRL BLUE (TOWEL DISPOSABLE) ×4 IMPLANT
TRAY FOLEY CATH 14FR (SET/KITS/TRAYS/PACK) ×2 IMPLANT
WATER STERILE IRR 1000ML POUR (IV SOLUTION) ×2 IMPLANT

## 2014-08-19 NOTE — Progress Notes (Signed)
I assisted Engineer, manufacturingCarmen RN with some questions and went to RX department, by Orlan LeavensViria Alvarez interpreter

## 2014-08-19 NOTE — Plan of Care (Signed)
Problem: Consults Goal: Postpartum Patient Education (See Patient Education module for education specifics.) Outcome: Completed/Met Date Met:  08/19/14  Problem: Phase I Progression Outcomes Goal: Pain controlled with appropriate interventions Outcome: Completed/Met Date Met:  08/19/14 Goal: Voiding adequately Outcome: Completed/Met Date Met:  08/19/14 Goal: OOB as tolerated unless otherwise ordered Outcome: Completed/Met Date Met:  08/19/14 Goal: VS, stable, temp < 100.4 degrees F Outcome: Completed/Met Date Met:  08/19/14

## 2014-08-19 NOTE — Plan of Care (Signed)
Problem: Discharge Progression Outcomes Goal: Activity appropriate for discharge plan Outcome: Completed/Met Date Met:  08/19/14

## 2014-08-19 NOTE — Transfer of Care (Signed)
Immediate Anesthesia Transfer of Care Note  Patient: Hannah [name] Hannah [name]  Procedure(s) Performed: Procedure(s): POST PARTUM TUBAL LIGATION (Bilateral)  Patient Location: PACU  Anesthesia Type:Spinal  Level of Consciousness: awake  Airway & Oxygen Therapy: Patient Spontanous Breathing  Post-op Assessment: Report given to PACU RN  Post vital signs: Reviewed and stable  Complications: No apparent anesthesia complications

## 2014-08-19 NOTE — Progress Notes (Signed)
Checked on patients needs and ordered a snack for pt.  Spanish Interpreter - Joselyn GlassmanBenita [name]

## 2014-08-19 NOTE — Anesthesia Procedure Notes (Signed)
Spinal Patient location during procedure: OR Start time: 08/19/2014 9:45 AM Staffing Anesthesiologist: CASSIDY, AMY Performed by: anesthesiologist  Preanesthetic Checklist Completed: patient identified, site marked, surgical consent, pre-op evaluation, timeout performed, IV checked, risks and benefits discussed and monitors and equipment checked Spinal Block Patient position: sitting Prep: site prepped and draped and DuraPrep Patient monitoring: heart rate, cardiac monitor, continuous pulse ox and blood pressure Approach: midline Location: L3-4 Injection technique: single-shot Needle Needle type: Pencan  Needle gauge: 24 G Needle length: 9 cm Assessment Sensory level: T4 Additional Notes Clear free flow CSF on first attempt. No paresthesia.  Patient tolerated procedure well with no apparent complications.  [name] DecemberA. Cassidy, MD

## 2014-08-19 NOTE — Plan of Care (Signed)
Problem: Phase I Progression Outcomes Goal: Initial discharge plan identified Outcome: Completed/Met Date Met:  08/19/14  Problem: Phase II Progression Outcomes Goal: Progress activity as tolerated unless otherwise ordered Outcome: Completed/Met Date Met:  08/19/14

## 2014-08-19 NOTE — Progress Notes (Signed)
Assisted RN with interpretation of patient care instructions.   Spanish Interpreter - Hannah GlassmanBenita [name]

## 2014-08-19 NOTE — Plan of Care (Signed)
Problem: Phase II Progression Outcomes Goal: Tolerating diet Outcome: Completed/Met Date Met:  08/19/14     

## 2014-08-19 NOTE — Anesthesia Preprocedure Evaluation (Signed)
Anesthesia Evaluation  Patient identified by MRN, date of birth, ID band Patient awake    Reviewed: Allergy & Precautions, H&P , Patient's Chart, lab work & pertinent test results, reviewed documented beta blocker date and time   Airway Mallampati: II  TM Distance: >3 FB Neck ROM: full    Dental no notable dental hx.    Pulmonary neg pulmonary ROS,  breath sounds clear to auscultation        Cardiovascular negative cardio ROS      Neuro/Psych    GI/Hepatic negative GI ROS, Neg liver ROS,   Endo/Other  negative endocrine ROS  Renal/GU negative Renal ROS     Musculoskeletal   Abdominal Normal abdominal exam  (+)   Peds  Hematology negative hematology ROS (+)   Anesthesia Other Findings   Reproductive/Obstetrics (+) Breast feeding                              Anesthesia Physical Anesthesia Plan  ASA: II  Anesthesia Plan: Spinal   Post-op Pain Management:    Induction:   Airway Management Planned:   Additional Equipment:   Intra-op Plan:   Post-operative Plan:   Informed Consent: I have reviewed the patients History and Physical, chart, labs and discussed the procedure including the risks, benefits and alternatives for the proposed anesthesia with the patient or authorized representative who has indicated his/her understanding and acceptance.     Plan Discussed with: Anesthesiologist and CRNA  Anesthesia Plan Comments:         Anesthesia Quick Evaluation

## 2014-08-19 NOTE — Anesthesia Postprocedure Evaluation (Signed)
  Anesthesia Post-op Note  Anesthesia Post Note  Patient: Hannah [name]  Procedure(s) Performed: Procedure(s) (LRB): POST PARTUM TUBAL LIGATION (Bilateral)  Anesthesia type: Spinal  Patient location: PACU  Post pain: Pain level controlled  Post assessment: Post-op Vital signs reviewed  Last Vitals:  Filed Vitals:   08/19/14 1230  BP: 148/80  Pulse: 64  Temp: 36.8 C  Resp: 20    Post vital signs: Reviewed  Level of consciousness: awake  Complications: No apparent anesthesia complications

## 2014-08-19 NOTE — Progress Notes (Signed)
Post Partum Day 1 Subjective: no complaints, up ad lib, voiding and tolerating PO  Objective: Blood pressure 130/77, pulse 61, temperature 98 F (36.7 C), temperature source Oral, resp. rate 20, height 5\' 2"  (1.575 m), weight 214 lb (97.07 kg), last menstrual period 11/28/2013, SpO2 96 %, not currently breastfeeding.  Physical Exam:  General: alert, cooperative and no distress Lochia: appropriate Uterine Fundus: firm DVT Evaluation: No evidence of DVT seen on physical exam. Negative Homan's sign.   Recent Labs  08/18/14 1000  HGB 12.6  HCT 38.2    Assessment/Plan: Breastfeeding and Contraception BTL  Discussed risks of BTL including infection, bleeding, damage to adjacent organs.  PT NPO since last night.  Will continue to be NPO throughout procedure.  Consent signed and on chart.     LOS: 1 day   STINSON, JACOB JEHIEL 08/19/2014, 8:49 AM

## 2014-08-19 NOTE — Progress Notes (Signed)
Checked on patient and ordered patient a snack.  Spanish Interpreter - Joselyn GlassmanBenita [name]

## 2014-08-19 NOTE — Anesthesia Postprocedure Evaluation (Signed)
  Anesthesia Post-op Note  Patient: Hannah [name]  Procedure(s) Performed: Procedure(s): POST PARTUM TUBAL LIGATION (Bilateral)  Patient Location: Mother/Baby  Anesthesia Type:Spinal  Level of Consciousness: awake  Airway and Oxygen Therapy: Patient Spontanous Breathing  Post-op Pain: mild  Post-op Assessment: Patient's Cardiovascular Status Stable and Respiratory Function Stable  Post-op Vital Signs: stable  Last Vitals:  Filed Vitals:   08/19/14 1330  BP: 136/78  Pulse: 76  Temp: 36.8 C  Resp: 22    Complications: No apparent anesthesia complications

## 2014-08-19 NOTE — Addendum Note (Signed)
Addendum  created 08/19/14 1622 by Renford DillsJanet L Avin Upperman, CRNA   Modules edited: Notes Section   Notes Section:  File: [id number]

## 2014-08-19 NOTE — Op Note (Signed)
Hannah HoardSonia SanchezMarquezz 08/18/2014 - 08/19/2014  PREOPERATIVE DIAGNOSIS:  Undesired fertility  POSTOPERATIVE DIAGNOSIS:  Undesired fertility  PROCEDURE:  Postpartum Bilateral Tubal Sterilization using Filshie Clips   SURGEON: Dr Adrian BlackwaterStinson  ASSISTANT:  ANESTHESIA:  Epidural  COMPLICATIONS:  None immediate.  ESTIMATED BLOOD LOSS:  Less than 20cc.  FLUIDS: 800mL LR.  URINE OUTPUT:  50 cc of clear urine.  INDICATIONS: 28 y.o. yo [id number]  with undesired fertility,status post vaginal delivery, desires permanent sterilization. Risks and benefits of procedure discussed with patient including permanence of method, bleeding, infection, injury to surrounding organs and need for additional procedures. Risk failure of 0.5-1% with increased risk of ectopic gestation if pregnancy occurs was also discussed with patient.   FINDINGS:  Normal uterus, tubes, and ovaries.  TECHNIQUE:  The patient was taken to the operating room where her epidural anesthesia was dosed up to surgical level and found to be adequate.  She was then placed in the dorsal supine position and prepped and draped in sterile fashion.  After an adequate timeout was performed, attention was turned to the patient's abdomen where a small transverse skin incision was made under the umbilical fold. The incision was taken down to the layer of fascia using the scalpel, and fascia was incised, and extended bilaterally using Mayo scissors. The peritoneum was entered in a sharp fashion. Attention was then turned to the patient's uterus, and left fallopian tube was identified and followed out to the fimbriated end.  A Filshie clip was placed on the left fallopian tube about 2 cm from the cornual attachment, with care given to incorporate the underlying mesosalpinx.  A similar process was carried out on the rightl side allowing for bilateral tubal sterilization.  Good hemostasis was noted overall.  Local analgesia was drizzled on both operative sites.The  instruments were then removed from the patient's abdomen and the fascial incision was repaired with 0 Vicryl.  The subcutaneous layer was closed with 2-0 plain and the skin was closed with a 3-0 Monocryl subcuticular stitch. The patient tolerated the procedure well.  Sponge, lap, and needle counts were correct times two.  The patient was then taken to the recovery room awake, extubated and in stable condition.

## 2014-08-19 NOTE — Plan of Care (Signed)
Problem: Phase II Progression Outcomes Goal: Pain controlled on oral analgesia Outcome: Completed/Met Date Met:  08/19/14 Goal: Afebrile, VS remain stable Outcome: Completed/Met Date Met:  08/19/14  Problem: Discharge Progression Outcomes Goal: Tolerating diet Outcome: Completed/Met Date Met:  08/19/14

## 2014-08-20 MED ORDER — OXYCODONE-ACETAMINOPHEN 5-325 MG PO TABS: 1.0000 | ORAL_TABLET | ORAL | Status: DC | PRN

## 2014-08-20 MED ORDER — IBUPROFEN 600 MG PO TABS: 600.0000 mg | ORAL_TABLET | Freq: Four times a day (QID) | ORAL | Status: DC | PRN

## 2014-08-20 NOTE — Lactation Note (Signed)
This note was copied from the chart of Hannah [name]. Lactation Consultation Note  Interpreter present. Baby fussy at the breast, pulls off and on. Mother states she has no milk.  Demonstrated hand expression and mother has good flow of colostrum. Parents started supplementing with formula and states baby drinks it very fast. Explained that baby has to work at the breast and the reason he is getting frustrated because the flow with the bottle has been faster. Provided mother with a hand pump and suggest she post pump to help boost her milk supply to pacify baby. Explained cluster feeding and how to monitor voids/stools.   Patient Name: Hannah [name] [id number]'s Date: 08/20/2014 Reason for consult: Initial assessment   Maternal Data Has patient been taught Hand Expression?: Yes Does the patient have breastfeeding experience prior to this delivery?: Yes  Feeding Feeding Type: Breast Fed Length of feed: 5 min  LATCH Score/Interventions Latch: Repeated attempts needed to sustain latch, nipple held in mouth throughout feeding, stimulation needed to elicit sucking reflex.  Audible Swallowing: A few with stimulation  Type of Nipple: Everted at rest and after stimulation  Comfort (Breast/Nipple): Soft / non-tender     Hold (Positioning): No assistance needed to correctly position infant at breast.  LATCH Score: 8  Lactation Tools Discussed/Used     Consult Status Consult Status: Complete    Hardie PulleyBerkelhammer, Ruth Boschen 08/20/2014, 10:14 AM

## 2014-08-20 NOTE — Discharge Instructions (Signed)
Cuidados en el postparto luego de un parto vaginal  (Postpartum Care After Vaginal Delivery) Despus del parto (perodo de postparto), la estada normal en el hospital es de 24-72 horas. Si hubo problemas con el trabajo de parto o el parto, o si tiene otros problemas mdicos, es posible que Patent attorney en el hospital por ms [place].  Mientras est en el hospital, recibir [place] e instrucciones sobre cmo cuidar de usted misma y de su beb recin nacido durante el postparto.  Mientras est en el hospital:   Asegrese de decirle a las enfermeras si siente dolor o Tree surgeon, as como donde Designer, television/film set y Architect.  Si usted tuvo una incisin cerca de la vagina (episiotoma) o si ha tenido Education officer, museum, las enfermeras le pondrn hielo sobre la episiotoma o Psychiatrist. Las bolsas de hielo pueden ayudar a Dietitian y la hinchazn.  Si est amamantando, puede sentir contracciones dolorosas en el tero durante algunas semanas. Esto es normal. Las contracciones ayudan a que el tero vuelva a su tamao normal.  Es normal tener algo de sangrado despus del [place]o.  Durante los primeros 1-3 das despus del parto, el flujo es de color rojo y la cantidad puede ser similar a un perodo.  Es frecuente que el flujo se inicie y se Production assistant, radio.  En los primeros [place], puede eliminar algunos cogulos pequeos. Informe a las enfermeras si elimina cogulos grandes o aumenta el flujo.  No  elimine los cogulos de sangre por el inodoro antes de que la [employer] vea.  Durante los prximos 3 a [address] despus del parto, el flujo debe ser ms acuoso y rosado o Forensic psychologist.  Chancy Hurter a catorce [employer] del parto, el flujo debe ser una pequea cantidad de secrecin de color blanco amarillento.  La cantidad de flujo disminuir en las primeras semanas despus del parto. El flujo puede detenerse en 6-8 semanas. La mayora de las mujeres no tienen ms flujo a las 12 semanas  despus del [place].  Usted debe cambiar sus apsitos con frecuencia.  Lvese bien las manos con agua y jabn durante al menos 20 segundos despus de cambiar el apsito, usar el bao o antes de Nature conservation officer o Research scientist (life sciences) a su recin nacido.  Usted podr sentir como que tiene que vaciar la vejiga durante las primeras 6-8 horas despus del [place].  En caso de que sienta debilidad, mareo o [place], llame a la enfermera antes de levantarse de la cama por primera vez y antes de tomar una ducha por primera vez.  Dentro de los Coca[employer] del parto, sus mamas pueden comenzar a estar sensibles y [place]. Esto se llama congestin. La sensibilidad en los senos por lo general desaparece dentro de las 48-72 horas despus de que ocurre la congestin. Tambin puede notar que la [place] se escapa de sus senos. Si no est amamantando no estimule sus pechos. La estimulacin de las mamas hace que sus senos produzcan ms [place].  Pasar tanto tiempo como le sea posible con el beb recin nacido es muy importante. Durante ese tiempo, usted y su beb deben sentirse cerca y conocerse uno al otro. Tener al beb en su habitacin (alojamiento conjunto) ayudar a fortalecer el vnculo con el beb recin nacido.Esto le dar tiempo para conocerlo y atenderlo de [place] cmoda.  Las hormonas se modifican despus del parto. A veces, los cambios hormonales pueden causar tristeza o ganas de llorar por un tiempo. Estos sentimientos  no deben durar ms de Hughes Supplyunos pocos das. Si duran ms que eso, debe hablar con su mdico.  Si lo desea, hable con su mdico acerca de los mtodos de planificacin familiar o mtodos anticonceptivos.  Hable con su mdico acerca de las vacunas. El mdico puede indicarle que se aplique las siguientes vacunas antes de salir del hospital:  [place] and PrincipeVacuna contra el ttanos, la difteria y la tos ferina (Tdap) o el ttanos y la difteria (Td). Es muy importante que usted y su familia (incluyendo a los abuelos) u otras  personas que cuidan al recin nacido estn al da con las vacunas Tdap o Td. Las vacunas Tdap o Td pueden ayudar a proteger al recin nacido de enfermedades.  Inmunizacin contra la rubola.  Inmunizacin contra la varicela.  Inmunizacin contra la gripe. Usted debe recibir esta vacunacin anual si no la ha recibido Academic librariandurante el embarazo. Document Released: 06/29/2007 Document Revised: 05/26/2012 Operating Room ServicesExitCare Patient Information 2015 [place] FargoExitCare, MarylandLLC. This information is not intended to replace advice given to you by your health care provider. Make sure you discuss any questions you have with your health care provider.  Lactancia materna (Breastfeeding) Decidir Museum/gallery exhibitions officeramamantar es una de las mejores elecciones que puede hacer por usted y su beb. El cambio hormonal durante el Psychiatristembarazo produce el desarrollo del tejido mamario y Lesothoaumenta la cantidad y el tamao de los conductos galactforos. Estas hormonas tambin permiten que las protenas, los azcares y las grasas de la sangre produzcan la WPS Resourcesleche materna en las glndulas productoras de Hamletleche. Las hormonas impiden que la leche materna sea liberada antes del nacimiento del beb, adems de impulsar el flujo de leche luego del nacimiento. Una vez que ha comenzado a Museum/gallery exhibitions officeramamantar, Conservation officer, naturepensar en el beb, as Immunologistcomo la succin o Theatre managerel llanto, pueden estimular la liberacin de Grissom AFBleche de las glndulas productoras de Ingallsleche.  LOS BENEFICIOS DE AMAMANTAR Para el beb  La primera leche (calostro) ayuda a Careers information officermejorar el funcionamiento del sistema digestivo del beb.  La leche tiene anticuerpos que ayudan a Radio producerprevenir las infecciones en el beb.  El beb tiene una menor incidencia de asma, alergias y del sndrome de muerte sbita del lactante.  Los nutrientes en la Nicoma Parkleche materna son mejores para el beb que la Deversleche maternizada y estn preparados exclusivamente para cubrir las necesidades del beb.  La leche materna mejora el desarrollo cerebral del beb.  Es menos probable que el beb  desarrolle otras enfermedades, como obesidad infantil, asma o diabetes mellitus de tipo 2. Para usted   La lactancia materna favorece el desarrollo de un vnculo muy especial entre la madre y el beb.  Es conveniente. La leche materna siempre est disponible a la Human resources officertemperatura correcta y es Marysvilleeconmica.  La lactancia materna ayuda a quemar caloras y a perder el peso ganado durante el New Egyptembarazo.  Favorece la contraccin del tero al tamao que tena antes del embarazo de manera ms rpida y disminuye el sangrado (loquios) despus del parto.  La lactancia materna contribuye a reducir Nurse, adultel riesgo de desarrollar diabetes mellitus de tipo 2, osteoporosis o cncer de mama o de ovario en el futuro. SIGNOS DE QUE EL BEB EST HAMBRIENTO Primeros signos de [address]hambre  Aumenta su estado de Lesothoalerta o actividad.  Se estira.  Mueve la cabeza de un lado a otro.  Mueve la cabeza y abre la boca cuando se le toca la mejilla o la comisura de la boca (reflejo de bsqueda).  Aumenta las vocalizaciones, tales como sonidos de succin, se relame los labios,  emite arrullos, suspiros, o chirridos.  Mueve la Jones Apparel Groupmano hacia la boca.  Se chupa con ganas los dedos o las manos. Signos tardos de Fisher Scientifichambre  Est agitado.  Llora de manera intermitente. Signos de AES Corporationhambre extrema Los signos de hambre extrema requerirn que lo calme y lo consuele antes de que el beb pueda alimentarse adecuadamente. No espere a que se manifiesten los siguientes signos de hambre extrema para comenzar a Museum/gallery exhibitions officeramamantar:   Designer, jewelleryAgitacin.  Llanto intenso y fuerte.   Gritos. INFORMACIN BSICA SOBRE LA LACTANCIA MATERNA Iniciacin de la lactancia materna  Encuentre un lugar cmodo para sentarse o acostarse, con un buen respaldo para el cuello y la espalda.  Coloque una almohada o una manta enrollada debajo del beb para acomodarlo a la altura de la mama (si est sentada). Las almohadas para Museum/gallery exhibitions officeramamantar se han diseado especialmente a fin de servir de apoyo  para los brazos y el beb Smithfield Foodsmientras amamanta.  Asegrese de que el abdomen del beb est frente al suyo.  Masajee suavemente la mama. Con las yemas de los dedos, masajee la pared del pecho hacia el pezn en un movimiento circular. Esto estimula el flujo de Crucibleleche. Es posible que Engineer, manufacturing systemsdeba continuar este movimiento mientras amamanta si la leche fluye lentamente.  Sostenga la mama con el pulgar por arriba del pezn y los otros 4 dedos por debajo de la mama. Asegrese de que los dedos se encuentren lejos del pezn y de la boca del beb.  Empuje suavemente los labios del beb con el pezn o con el dedo.  Cuando la boca del beb se abra lo suficiente, acrquelo rpidamente a la mama e introduzca todo el pezn y la zona oscura que lo rodea (areola), tanto como sea posible, dentro de la boca del beb.  Debe haber ms areola visible por arriba del labio superior del beb que por debajo del labio inferior.  La lengua del beb debe estar entre la enca inferior y la Texicomama.  Asegrese de que la boca del beb est en la posicin correcta alrededor del pezn (prendida). Los labios del beb deben crear un sello sobre la mama y estar doblados hacia afuera (invertidos).  Es comn que el beb succione durante 2 a 3 minutos para que comience el flujo de Landingvilleleche materna. Cmo debe prenderse Es muy importante que le ensee al beb cmo prenderse adecuadamente a la mama. Si el beb no se prende adecuadamente, puede causarle dolor en el pezn y reducir la produccin de Bakersfield Country Clubleche materna, y hacer que el beb tenga un escaso aumento de [place]o. Adems, si el beb no se prende adecuadamente al pezn, puede tragar aire durante la alimentacin. Esto puede causarle molestias al beb. Hacer eructar al beb al Pilar Platecambiar de mama puede ayudarlo a liberar el aire. Sin embargo, ensearle al beb cmo prenderse a la mama adecuadamente es la mejor manera de evitar que se sienta molesto por tragar Oceanographeraire mientras se alimenta. Signos de que el beb se  ha prendido adecuadamente al pezn:   Payton Doughtyironea o succiona de modo silencioso, sin causarle dolor.  Se escucha que traga cada 3 o 4 succiones.   Hay movimientos musculares por arriba y por delante de sus odos al Printmakersuccionar. Signos de que el beb no se ha prendido Audiological scientistadecuadamente al pezn:   Hace ruidos de succin o de chasquido mientras se alimenta.  Siente dolor en el pezn. Si cree que el beb no se prendi correctamente, deslice el dedo en la comisura de la boca y colquelo  entre las encas del beb para interrumpir la succin. Intente comenzar a amamantar nuevamente. Signos de Fish farm managerlactancia materna exitosa Signos del beb:   Disminuye gradualmente el nmero de succiones o cesa la succin por completo.  Se duerme.  Relaja el cuerpo.  Retiene una pequea cantidad de Kindred Healthcareleche en la boca.  Se desprende solo del pecho. Signos que presenta usted:  Las mamas han aumentado la firmeza, el peso y el tamao 1 a 3 horas despus de Museum/gallery exhibitions officeramamantar.  Estn ms blandas inmediatamente despus de amamantar.  Un aumento del volumen de McIntyreleche, y tambin un cambio en su consistencia y color se producen hacia el quinto da de Tour managerlactancia materna.  Los pezones no duelen, ni estn agrietados ni sangran. Signos de que su beb recibe la cantidad de leche suficiente  Moja al menos 3 paales en 24 horas. La orina debe ser clara y de color amarillo plido a los [address]  [address] de [place]vida.  Defeca al menos 3 veces en 24 horas a los [address]  [address] de [address]. La materia fecal debe ser blanda y Pajarito Mesaamarillenta.  Defeca al menos 3 veces en 24 horas a los [address] das de [address]. La materia fecal debe ser grumosa y Hato Candalamarillenta.  No registra una prdida de peso mayor del 10% del peso al nacer durante los primeros 3 [address]  [address]das de [place]ida.  Aumenta de peso un promedio de 4 a 7onzas (113 a 198g) por semana despus de los 4 [address]  [address] de vida.  Aumenta de Bradfordpeso, Flushingdiariamente, de Hazardmanera uniforme a Glass blower/designerpartir de los 5 [address]  [address] de vida, sin Passenger transport managerregistrar prdida de peso despus de las  2semanas de vida. Despus de alimentarse, es posible que el beb regurgite una pequea cantidad. Esto es frecuente. FRECUENCIA Y DURACIN DE LA LACTANCIA MATERNA El amamantamiento frecuente la ayudar a producir ms Azerbaijanleche y a Education officer, communityprevenir problemas de Engineer, miningdolor en los pezones e hinchazn en las Town Linemamas. Alimente al beb cuando muestre signos de hambre o si siente la necesidad de reducir la congestin de las [place] Hillmamas. Esto se denomina "lactancia a demanda". Evite el uso del chupete mientras trabaja para establecer la lactancia (las primeras 4 a 6 semanas despus del nacimiento del beb). Despus de este perodo, podr ofrecerle un chupete. Las investigaciones demostraron que el uso del chupete durante el primer ao de vida del beb disminuye el riesgo de desarrollar el sndrome de muerte sbita del lactante (SMSL). Permita que el nio se alimente en cada mama todo lo que desee. Contine amamantando al beb hasta que haya terminado de alimentarse. Cuando el beb se desprende o se queda dormido mientras se est alimentando de la primera mama, ofrzcale la segunda. Debido a que, con frecuencia, los recin Sunoconacidos permanecen somnolientos las primeras semanas de vida, es posible que deba despertar al beb para alimentarlo. Los horarios de Acupuncturistlactancia varan de un beb a otro. Sin embargo, las siguientes reglas pueden servir como gua para ayudarla a Lawyergarantizar que el beb se alimenta adecuadamente:  Se puede amamantar a los recin nacidos (bebs de 4 semanas o menos de vida) cada 1 a 3 horas.  No deben transcurrir ms de 3 horas durante el da o 5 horas durante la noche sin que se amamante a los recin nacidos.  Debe amamantar al beb 8 veces como mnimo en un perodo de 24 horas, hasta que comience a introducir slidos en su dieta, a los 6 meses de vida aproximadamente. EXTRACCIN DE Dean Foods CompanyLECHE MATERNA La extraccin y Contractorel almacenamiento de la leche materna le permiten asegurarse de que el  beb se alimente exclusivamente de  Colgate Palmoliveleche materna, aun en momentos en los que no puede amamantar. Esto tiene especial importancia si debe regresar al Aleen Campitrabajo en el perodo en que an est amamantando o si no puede estar presente en los momentos en que el beb debe alimentarse. Su asesor en lactancia puede orientarla sobre cunto tiempo es seguro almacenar Port Alexanderleche materna.  El sacaleche es un aparato que le permite extraer leche de la mama a un recipiente estril. Luego, la leche materna extrada puede almacenarse en un refrigerador o Electrical engineercongelador. Algunos sacaleches son Birdie Riddlemanuales, Delaney Meigsmientras que otros son elctricos. Consulte a su asesor en lactancia qu tipo ser ms conveniente para usted. Los sacaleches se pueden comprar; sin embargo, algunos hospitales y grupos de apoyo a la lactancia materna alquilan Sports coachsacaleches mensualmente. Un asesor en lactancia puede ensearle cmo extraer W. R. Berkleyleche materna manualmente, en caso de que prefiera no usar un sacaleche.  CMO CUIDAR LAS MAMAS DURANTE LA LACTANCIA MATERNA Los pezones se secan, agrietan y duelen durante la Tour managerlactancia materna. Las siguientes recomendaciones pueden ayudarla a Pharmacologistmantener las TEPPCO Partnersmamas humectadas y sanas:  Careers information officervite usar jabn en los pezones.  Use un sostn de soporte. Aunque no son esenciales, las camisetas sin mangas o los sostenes especiales para Museum/gallery exhibitions officeramamantar estn diseados para acceder fcilmente a las mamas, para Museum/gallery exhibitions officeramamantar sin tener que quitarse todo el sostn o la camiseta. Evite usar sostenes con aro o sostenes muy ajustados.  Seque al aire sus pezones durante 3 a 4minutos despus de amamantar al beb.  Utilice solo apsitos de Haematologistalgodn en el sostn para Environmental health practitionerabsorber las prdidas de Hudsonleche. La prdida de un poco de Public Service Enterprise Groupleche materna entre las tomas es normal.  Utilice lanolina sobre los pezones luego de Museum/gallery exhibitions officeramamantar. La lanolina ayuda a mantener la humedad normal de la piel. Si Botswanausa lanolina pura, no tiene que lavarse los pezones antes de volver a Corporate treasureralimentar al beb. La lanolina pura no es txica para el  beb. Adems, puede extraer Beazer Homesmanualmente algunas gotas de Cape Royaleleche materna y Engineer, maintenance (IT)masajear suavemente esa Winn-Dixieleche sobre los pezones, para que la Terltonleche se seque al aire. Durante las primeras semanas despus de dar a luz, algunas mujeres pueden experimentar hinchazn en las mamas (congestin Strandburgmamaria). La congestin puede hacer que sienta las mamas pesadas, calientes y sensibles al tacto. El pico de la congestin ocurre dentro de los 3 a 5 das despus del Hillsboroughparto. Las siguientes recomendaciones pueden ayudarla a Paramedicaliviar la congestin:  Vace por completo las mamas al QUALCOMMamamantar o Environmental health practitionerextraer leche. Puede aplicar calor hmedo en las mamas (en la ducha o con toallas hmedas para manos) antes de Museum/gallery exhibitions officeramamantar o extraer WPS Resourcesleche. Esto aumenta la circulacin y Saint Vincent and the Grenadinesayuda a que la Holyroodleche fluya. Si el beb no vaca por completo las [address]amas cuando lo [address] Aveamamanta, extraiga la Judsonialeche restante despus de que haya finalizado.  Use un sostn ajustado (para amamantar o comn) o una camiseta sin mangas durante 1 o 2 das para indicar al cuerpo que disminuya ligeramente la produccin de Olneyleche.  Aplique compresas de hielo Yahoo! Incsobre las mamas, a menos que le resulte demasiado incmodo.  Asegrese de que el beb est prendido y se encuentre en la posicin correcta mientras lo alimenta. Si la congestin persiste luego de 48 horas o despus de seguir estas recomendaciones, comunquese con su mdico o un Holiday representativeasesor en lactancia. RECOMENDACIONES GENERALES PARA EL CUIDADO DE LA SALUD DURANTE LA LACTANCIA MATERNA  Consuma alimentos saludables. Alterne comidas y colaciones, y coma 3 de cada una por da. Dado que lo que come  afecta la Colgate Palmolive, es posible que algunas comidas hagan que su beb se vuelva ms irritable de lo habitual. Evite comer este tipo de alimentos si percibe que afectan de manera negativa al beb.  Beba leche, jugos de fruta y agua para Patent examiner su sed (aproximadamente 10 vasos al Futures trader).  Descanse con frecuencia, reljese y tome sus vitaminas  prenatales para evitar la fatiga, el estrs y la anemia.  Contine con los autocontroles de la mama.  Evite masticar y fumar tabaco.  Evite el consumo de alcohol y drogas. Algunos medicamentos, que pueden ser perjudiciales para el beb, pueden pasar a travs de la [employer] Palmolive. Es importante que consulte a su mdico antes de Medical sales representative, incluidos todos los medicamentos recetados y de [place], as como los suplementos vitamnicos y herbales. Puede quedar embarazada durante la lactancia. Si desea controlar la natalidad, consulte a su mdico cules son las opciones ms seguras para el beb. SOLICITE ATENCIN MDICA SI:   Usted siente que quiere dejar de Museum/gallery exhibitions officer o se siente frustrada con la lactancia.  Siente dolor en las mamas o en los pezones.  Sus pezones estn agrietados o Water quality scientist.  Sus pechos estn irritados, sensibles o calientes.  Tiene un rea hinchada en cualquiera de las mamas.  Siente escalofros o fiebre.  Tiene nuseas o vmitos.  Presenta una secrecin de otro lquido distinto de la leche materna de los pezones.  Sus mamas no se llenan antes de Museum/gallery exhibitions officer al beb para el quinto da despus del [place].  Se siente triste y deprimida.  El beb est demasiado somnoliento como para comer bien.  El beb tiene problemas para dormir.  Moja menos de 3 paales en 24 horas.  Defeca menos de 3 veces en 24 horas.  La piel del beb o la parte blanca de los ojos se vuelven amarillentas.  El beb no ha aumentado de [place] a los [address] de [place]. SOLICITE ATENCIN MDICA DE INMEDIATO SI:   El beb est muy cansado Retail buyer) y no se quiere despertar para comer.  Le sube la fiebre sin causa. Document Released: 09/01/2005 Document Revised: 09/06/2013 [facility] Patient Information 2015 [place], [place]. This information is not intended to replace advice given to you by your health care provider. Make sure you discuss any questions you have with your health care  provider.  Ligadura de trompas en el postparto  (Postpartum Tubal Ligation) La ligadura de trompas en el postparto es un procedimiento que obstruye las trompas inmediatamente despus del nacimiento o a los 1  2 das despus del nacimiento. Se realiza antes de que el tero vuelva a su ubicacin normal. El procedimiento tambin se llama minilaparotoma. Al cerrar las trompas de [place], los vulos que se liberan de los ovarios no pueden entrar en el tero y los espermatozoides no pueden llegar al vulo. La ligadura de trompas significa que no podr quedar embarazada o tener un beb.  Aunque en algunos casos pueda revertirse, debera [employer] e irreversible. Si desea quedar embarazada en el futuro, no debe realizarse este procedimiento. INFORME A SU MDICO SOBRE:   Alergias a alimentos o medicamentos.  Medicamentos que [place], incluyendo vitaminas, hierbas, gotas oftlmicas, medicamentos de venta libre y cremas.  Uso de corticoides (por va oral o cremas).  Problemas anteriores debido a anestsicos.  Antecedentes de hemorragias o cogulos sanguneos.  Resfros o infecciones recientes.  Cirugas anteriores.  Otros problemas de salud, incluyendo diabetes y problemas renales. RIESGOS Y COMPLICACIONES   Infecciones.  Sangrado.  Lesiones en otros rganos.  Efectos secundarios de Higher education careers adviser.  Fallas en el procedimiento.  Embarazo ectpico.  Arrepentimiento por haber realizado el procedimiento. ANTES DEL PROCEDIMIENTO   Es posible que tenga que firmar ciertos formularios de autorizacin con el seguro hasta 30 das antes de la [place].  Si el procedimiento se lleva a cabo el mismo da, despus del parto no podr comer ni beber nada. Si le hacen el procedimiento un da despus del parto, podr comer y beber Film/video editor. Su mdico le dar instrucciones especficas en funcin de su situacin. PROCEDIMIENTO   Si se realiza entre 1 y 2 [address] del  [address].  Durante el procedimiento le administrarn un medicamento que la har dormir (anestesia general).  Le colocarn un tubo por la garganta para ayudarla a respirar [employer] bajo los efectos de la anestesia general.  Conley Rolls harn un corte (incisin) en la zona del ombligo  Las trompas de [place] se buscan a travs de la incisin.  Luego se sellan, se atan o se cortan.  Si tuvo un parto por cesrea  La ligadura de trompas se realiza a travs de la incisin (despus del nacimiento del beb).  Una vez que las trompas estn bloqueadas, la incisin se cierra con puntos (suturas). Le colocarn una venda sobre las incisiones. DESPUS DEL PROCEDIMIENTO   Es posible que sienta algn dolor o clicos en la zona abdominal durante los 3 a 7 das siguientes.  Le administrarn analgsicos para Architectural technologist.  Tambin podr Pharmacist, community si le dieron anestesia general.  Podr sentir una leve molestia en la garganta. Se debe al tubo que le colocaron en la garganta mientras dorma.  Podr sentirse cansada y tendr que hacer reposo el resto del da. Document Released: 06/11/2005 Document Revised: 03/02/2012 [facility] Patient Information 2015 [place], [place]. This information is not intended to replace advice given to you by your health care provider. Make sure you discuss any questions you have with your health care provider.

## 2014-08-20 NOTE — Discharge Summary (Signed)
Obstetric Discharge Summary Reason for Admission: onset of labor Prenatal Procedures: ultrasound Intrapartum Procedures: spontaneous vaginal delivery and GBS prophylaxis Postpartum Procedures: P.P. tubal ligation Complications-Operative and Postpartum: none Eating, drinking, voiding, ambulating well.  +flatus.  Lochia and pain wnl.  Denies dizziness, lightheadedness, or sob. No complaints.   Hospital Course: Hannah [name] is a 28 y.o. [id number] female admited at 39.5wks in active labor. She progressed normally to NSVB. On PPD#1 she had BTL.  Her pp course has been uncomplicated.  By PPD#2/POD#1 she is doing well and is deemed to have received the full benefit of her hospital stay.  Filed Vitals:   08/20/14 0545  BP: 127/73  Pulse: 72  Temp: 97.8 F (36.6 C)  Resp: 18   H/H: Lab Results  Component Value Date/Time   HGB 12.6 08/18/2014 10:00 AM   HCT 38.2 08/18/2014 10:00 AM    Physical Exam: General: alert, cooperative and no distress Abdomen/Uterine Fundus: Appropriately tender, non-distended, FF @ U-1 Incision: honeycomb dressing intact, no drainage or s/s infection Lochia: appropriate Extremities: No evidence of DVT seen on physical exam. Negative Homan's sign, no cords, calf tenderness, or significant calf/ankle edema. Pt w/ tender varicose vein on Lt calf. Reviewed s/s DVT and reasons to seek care.   Discharge Diagnoses: Term Pregnancy-delivered and pp BTL  Discharge Information: Date: 03/27/2011 Activity: pelvic rest Diet: routine  Medications: PNV, Ibuprofen and Percocet Breast feeding: Yes Contraception: s/p BTL Circumcision: declined Condition: stable Instructions: refer to handout Discharge to: home  Infant: Home with mother  Follow-up Information    [facility] [place]   Why:  4-6 weeks for your postpartum visit   Contact information:   [address] [place] [id number] [phone]      Hannah [name], Hannah Viramontes  [name], CNM, WHNP-BC 08/20/2014,7:13 AM

## 2014-08-20 NOTE — Plan of Care (Signed)
Problem: Discharge Progression Outcomes Goal: Barriers To Progression Addressed/Resolved Outcome: Not Applicable Date Met:  68/40/33 Goal: Afebrile, VS remain stable at discharge Outcome: Completed/Met Date Met:  08/20/14 Goal: Discharge plan in place and appropriate Outcome: Completed/Met Date Met:  08/20/14

## 2014-08-21 ENCOUNTER — Encounter (HOSPITAL_COMMUNITY): Payer: Self-pay | Admitting: Family Medicine

## 2014-08-21 NOTE — Progress Notes (Signed)
Post discharge ur review completed. 

## 2014-08-22 ENCOUNTER — Inpatient Hospital Stay (HOSPITAL_COMMUNITY)
Admission: AD | Admit: 2014-08-22 | Discharge: 2014-08-22 | Disposition: A | Discharge status: Home / Self Care | Payer: Medicaid Other | Source: Ambulatory Visit | Attending: Obstetrics and Gynecology | Admitting: Obstetrics and Gynecology

## 2014-08-22 ENCOUNTER — Encounter (HOSPITAL_COMMUNITY): Payer: Self-pay | Admitting: *Deleted

## 2014-08-22 DIAGNOSIS — R748 Abnormal levels of other serum enzymes: Secondary | ICD-10-CM | POA: Diagnosis not present

## 2014-08-22 DIAGNOSIS — R609 Edema, unspecified: Secondary | ICD-10-CM

## 2014-08-22 DIAGNOSIS — O9089 Other complications of the puerperium, not elsewhere classified: Secondary | ICD-10-CM | POA: Diagnosis present

## 2014-08-22 DIAGNOSIS — M7989 Other specified soft tissue disorders: Secondary | ICD-10-CM | POA: Insufficient documentation

## 2014-08-22 DIAGNOSIS — O1205 Gestational edema, complicating the puerperium: Secondary | ICD-10-CM

## 2014-08-22 DIAGNOSIS — M79662 Pain in left lower leg: Secondary | ICD-10-CM

## 2014-08-22 DIAGNOSIS — M79609 Pain in unspecified limb: Secondary | ICD-10-CM

## 2014-08-22 LAB — COMPREHENSIVE METABOLIC PANEL
ALK PHOS: 93 U/L (ref 39–117)
ALT: 56 U/L — ABNORMAL HIGH (ref 0–35)
AST: 41 U/L — AB (ref 0–37)
Albumin: 3 g/dL — ABNORMAL LOW (ref 3.5–5.2)
Anion gap: 14 (ref 5–15)
BUN: 13 mg/dL (ref 6–23)
CHLORIDE: 101 meq/L (ref 96–112)
CO2: 25 meq/L (ref 19–32)
CREATININE: 0.49 mg/dL — AB (ref 0.50–1.10)
Calcium: 9.4 mg/dL (ref 8.4–10.5)
GFR calc Af Amer: >90 mL/min (ref >90)
Glucose, Bld: 76 mg/dL (ref 70–99)
POTASSIUM: 4 meq/L (ref 3.7–5.3)
Sodium: 140 mEq/L (ref 137–147)
Total Protein: 6.7 g/dL (ref 6.0–8.3)

## 2014-08-22 LAB — URINALYSIS, ROUTINE W REFLEX MICROSCOPIC
Bilirubin Urine: NEGATIVE
GLUCOSE, UA: 100 mg/dL — AB
Glucose, UA: NEGATIVE mg/dL
KETONES UR: NEGATIVE mg/dL
Ketones, ur: 15 mg/dL — AB
NITRITE: NEGATIVE
Nitrite: POSITIVE — AB
PH: 7.5 (ref 5.0–8.0)
PH: 6.5 (ref 5.0–8.0)
PROTEIN: 100 mg/dL — AB
Protein, ur: NEGATIVE mg/dL
Specific Gravity, Urine: 1.01 (ref 1.005–1.030)
Specific Gravity, Urine: 1.015 (ref 1.005–1.030)
UROBILINOGEN UA: 0.2 mg/dL (ref 0.0–1.0)
Urobilinogen, UA: 0.2 mg/dL (ref 0.0–1.0)

## 2014-08-22 LAB — URINE MICROSCOPIC-ADD ON

## 2014-08-22 LAB — CBC
HEMATOCRIT: 34.2 % — AB (ref 36.0–46.0)
Hemoglobin: 11.2 g/dL — ABNORMAL LOW (ref 12.0–15.0)
MCH: 28.5 pg (ref 26.0–34.0)
MCHC: 32.7 g/dL (ref 30.0–36.0)
MCV: 87 fL (ref 78.0–100.0)
Platelets: 276 10*3/uL (ref 150–400)
RBC: 3.93 MIL/uL (ref 3.87–5.11)
RDW: 15.3 % (ref 11.5–15.5)
WBC: 8 10*3/uL (ref 4.0–10.5)

## 2014-08-22 LAB — PROTEIN / CREATININE RATIO, URINE
CREATININE, URINE: 30.53 mg/dL
Creatinine, Urine: 66.24 mg/dL
Protein Creatinine Ratio: 3.05 — ABNORMAL HIGH (ref 0.00–0.15)
Protein Creatinine Ratio: 0.13 (ref 0.00–0.15)
Total Protein, Urine: 93.1 mg/dL
Total Protein, Urine: 8.3 mg/dL

## 2014-08-22 LAB — D-DIMER, QUANTITATIVE (NOT AT ARMC): D DIMER QUANT: 1.02 ug{FEU}/mL — AB (ref 0.00–0.48)

## 2014-08-22 NOTE — MAU Note (Signed)
Spoke with Baxter HireKristen in vascular studies at [facility]: [facility] to schedule mobile service to evaluate pt here in MAU.  Left calf circumference 40.5 cm and very tender.  Right calf is 38.5 cm.

## 2014-08-22 NOTE — Progress Notes (Signed)
Left lower extremity venous duplex completed.  Left:  No evidence of DVT, superficial thrombosis, or Baker's cyst.  Right:  Negative for DVT in the common femoral vein.  

## 2014-08-22 NOTE — MAU Provider Note (Signed)
Chief Complaint: Leg Swelling   First Provider Initiated Contact with Patient 08/22/14 1234     SUBJECTIVE HPI: Hannah [name] is a 28 y.o. [id number] at four days postpartum uncomplicated vaginal delivery and BTL on 08/18/14 who presents with swelling of legs since yesterday, L>R. [facility] Instructed to come to MAU for further eval. No Hx blood clots. Denies HA, vision changes, epigastric pain.   Past Medical History  Diagnosis Date  . Infection     UTI in early preg  . Tuberculosis    OB History  Gravida Para Term Preterm AB SAB TAB Ectopic Multiple Living  4 4 4  0 0 0 0 0 0 4    # Outcome Date GA Lbr Len/2nd Weight Sex Delivery Anes PTL Lv  4 Term 08/18/14 [id number] 13:08 / 00:10 3.83 kg (8 lb 7.1 oz) M Vag-Spont None  Y  3 Term           2 Term           1 Term              Past Surgical History  Procedure Laterality Date  . No past surgeries    . Tubal ligation Bilateral 08/19/2014    Procedure: POST PARTUM TUBAL LIGATION;  Surgeon: Levie HeritageJacob J Stinson, DO;  Location: [facility];  Service: Gynecology;  Laterality: Bilateral;   History   Social History  . Marital Status: Married    Spouse Name: N/A    Number of Children: N/A  . Years of Education: N/A   Occupational History  . Not on file.   Social History Main Topics  . Smoking status: Never Smoker   . Smokeless tobacco: Never Used  . Alcohol Use: No  . Drug Use: No  . Sexual Activity: Not Currently    Birth Control/ Protection: None   Other Topics Concern  . Not on file   Social History Narrative   No current facility-administered medications on file prior to encounter.   Current Outpatient Prescriptions on File Prior to Encounter  Medication Sig Dispense Refill  . ibuprofen (ADVIL,MOTRIN) 600 MG tablet Take 1 tablet (600 mg total) by mouth every 6 (six) hours as needed for mild pain, moderate pain or cramping. 30 tablet 0  . oxyCODONE-acetaminophen (PERCOCET/ROXICET) 5-325 MG per tablet Take 1-2 tablets by  mouth every 4 (four) hours as needed for moderate pain or severe pain. 15 tablet 0  . Prenatal Vit-Fe Fumarate-FA (PRENATAL MULTIVITAMIN) TABS tablet Take 1 tablet by mouth daily at 12 noon.     No Known Allergies  ROS: Pertinent items in HPI. No SOB, urinary or GI complaints.   OBJECTIVE Blood pressure 118/71, pulse 77, temperature 98.5 F (36.9 C), temperature source Oral, resp. rate 18, last menstrual period 11/28/2013, currently breastfeeding. GENERAL: Well-developed, well-nourished female in no acute distress. No cyanosis. HEENT: Normocephalic. Sclera non-icteric.  HEART: normal rate and rhythm. No M/R/G.  RESP: normal effort. Lungs CTAB.  ABDOMEN: Soft, non-tender. Fundus ?@SP . Exam limited by body habitus.  EXTREMITIES: Bilat LE edema from feet to knees, L>R. Mild left calf tenderness, mild pos Homan's. No cords palpated.   NEURO: Alert and oriented SPECULUM EXAM: Deferred LAB RESULTS Results for orders placed or performed during the hospital encounter of 08/22/14 (from the past 168 hour(s))  Protein / creatinine ratio, urine   Collection Time: 08/22/14 11:55 AM  Result Value Ref Range   Creatinine, Urine 30.53 mg/dL   Total Protein, Urine 93.1 mg/dL  Protein Creatinine Ratio 3.05 (H) 0.00 - 0.15  Urinalysis, Routine w reflex microscopic   Collection Time: 08/22/14 11:55 AM  Result Value Ref Range   Color, Urine RED (A) YELLOW   APPearance CLOUDY (A) CLEAR   Specific Gravity, Urine 1.010 1.005 - 1.030   pH 7.5 5.0 - 8.0   Glucose, UA 100 (A) NEGATIVE mg/dL   Hgb urine dipstick LARGE (A) NEGATIVE   Bilirubin Urine SMALL (A) NEGATIVE   Ketones, ur 15 (A) NEGATIVE mg/dL   Protein, ur 161100 (A) NEGATIVE mg/dL   Urobilinogen, UA 0.2 0.0 - 1.0 mg/dL   Nitrite POSITIVE (A) NEGATIVE   Leukocytes, UA MODERATE (A) NEGATIVE  Urine microscopic-add on   Collection Time: 08/22/14 11:55 AM  Result Value Ref Range   Squamous Epithelial / LPF FEW (A) RARE   WBC, UA 7-10 <3 WBC/hpf    RBC / HPF TOO NUMEROUS TO COUNT <3 RBC/hpf   Urine-Other MICROSCOPIC EXAM PERFORMED ON UNCONCENTRATED URINE   D-dimer, quantitative   Collection Time: 08/22/14  1:48 PM  Result Value Ref Range   D-Dimer, Quant 1.02 (H) 0.00 - 0.48 ug/mL-FEU  CBC   Collection Time: 08/22/14  1:48 PM  Result Value Ref Range   WBC 8.0 4.0 - 10.5 K/uL   RBC 3.93 3.87 - 5.11 MIL/uL   Hemoglobin 11.2 (L) 12.0 - 15.0 g/dL   HCT 09.634.2 (L) 04.536.0 - 40.946.0 %   MCV 87.0 78.0 - 100.0 fL   MCH 28.5 26.0 - 34.0 pg   MCHC 32.7 30.0 - 36.0 g/dL   RDW 81.115.3 91.411.5 - 78.215.5 %   Platelets 276 150 - 400 K/uL  Comprehensive metabolic panel   Collection Time: 08/22/14  1:48 PM  Result Value Ref Range   Sodium 140 137 - 147 mEq/L   Potassium 4.0 3.7 - 5.3 mEq/L   Chloride 101 96 - 112 mEq/L   CO2 25 19 - 32 mEq/L   Glucose, Bld 76 70 - 99 mg/dL   BUN 13 6 - 23 mg/dL   Creatinine, Ser 9.560.49 (L) 0.50 - 1.10 mg/dL   Calcium 9.4 8.4 - 21.310.5 mg/dL   Total Protein 6.7 6.0 - 8.3 g/dL   Albumin 3.0 (L) 3.5 - 5.2 g/dL   AST 41 (H) 0 - 37 U/L   ALT 56 (H) 0 - 35 U/L   Alkaline Phosphatase 93 39 - 117 U/L   Total Bilirubin <0.2 (L) 0.3 - 1.2 mg/dL   GFR calc non Af Amer >90 >90 mL/min   GFR calc Af Amer >90 >90 mL/min   Anion gap 14 5 - 15  Urinalysis, Routine w reflex microscopic   Collection Time: 08/22/14  3:33 PM  Result Value Ref Range   Color, Urine YELLOW YELLOW   APPearance CLEAR CLEAR   Specific Gravity, Urine 1.015 1.005 - 1.030   pH 6.5 5.0 - 8.0   Glucose, UA NEGATIVE NEGATIVE mg/dL   Hgb urine dipstick LARGE (A) NEGATIVE   Bilirubin Urine NEGATIVE NEGATIVE   Ketones, ur NEGATIVE NEGATIVE mg/dL   Protein, ur NEGATIVE NEGATIVE mg/dL   Urobilinogen, UA 0.2 0.0 - 1.0 mg/dL   Nitrite NEGATIVE NEGATIVE   Leukocytes, UA TRACE (A) NEGATIVE  Protein / creatinine ratio, urine   Collection Time: 08/22/14  3:33 PM  Result Value Ref Range   Creatinine, Urine 66.24 mg/dL   Total Protein, Urine 8.3 mg/dL   Protein  Creatinine Ratio 0.13 0.00 - 0.15  Urine microscopic-add on  Collection Time: 08/22/14  3:33 PM  Result Value Ref Range   Squamous Epithelial / LPF FEW (A) RARE   WBC, UA 0-2 <3 WBC/hpf   RBC / HPF 7-10 <3 RBC/hpf   UA. P:C ratio repeated due to suspected contamination w/ lochia.   IMAGING LLE venous doppler negative for DVT.   MAU COURSE  ASSESSMENT 1. Elevated liver enzymes-low suspicion for true HELLP syndrome  2. Edema in pregnancy, postpartum condition w/out evidence of DVT.    PLAN Discharge home in stable condition per consult w/ Dr. Jolayne Panther.  Pre-E/HELLP precautions.  Repeat liver enzymes at office visit. Urine culture pending. Follow-up Information    Follow up with [facility] On 08/25/2014.   Specialty:  Obstetrics and Gynecology   Why:  at 9:00   Contact information:   [address] [place] [address] [phone]      Follow up with [facility] [place] [facility].   Why:  As needed in emergencies   Contact information:   [address] [id number] [address] [place] [address] [phone]       Medication List    ASK your doctor about these medications        ibuprofen 600 MG tablet  Commonly known as:  ADVIL,MOTRIN  Take 1 tablet (600 mg total) by mouth every 6 (six) hours as needed for mild pain, moderate pain or cramping.     oxyCODONE-acetaminophen 5-325 MG per tablet  Commonly known as:  PERCOCET/ROXICET  Take 1-2 tablets by mouth every 4 (four) hours as needed for moderate pain or severe pain.     prenatal multivitamin Tabs tablet  Take 1 tablet by mouth daily at 12 noon.       [place], CNM 08/22/2014  1:45 PM

## 2014-08-22 NOTE — MAU Note (Addendum)
Post partum  (12/4), swelling in lower extremities- started yesterday. Denies HA or epigastric pain No pain . Bleeding is WNL.  Baby is doing well- breast feeding.  No problems with preg or delivery

## 2014-08-22 NOTE — Discharge Instructions (Signed)
Hipertensin durante el embarazo (Hypertension During Pregnancy) Cuando se sufre hipertensin arterial o presin arterial alta, existe una presin extra en el interior de los vasos sanguneos que llevan la sangre desde el corazn al resto del cuerpo (arterias). Esto puede suceder en cualquier etapa de la vida, incluido el embarazo. La hipertensin durante el embarazo puede causar problemas para usted y el beb. Es posible que el beb no tenga el peso adecuado al nacer o puede que nazca antes de tiempo (prematuro). En los casos muy graves de hipertensin durante el embarazo puede estar en peligro la vida.  Durante el embarazo se pueden presentar diferentes tipos de hipertensin arterial. Estos incluyen:  Hipertensin crnica. Esto sucede cuando una mujer sufre de hipertensin arterial antes del embarazo y contina durante el este.  Hipertensin gestacional. Es cuando se desarrolla la hipertensin durante el embarazo.  Preeclampsia o toxemia del embarazo. Es un tipo muy grave de hipertensin que se desarrolla solo durante el embarazo. Afecta a todo el cuerpo y puede ser muy peligrosa para la madre y el beb. La hipertensin gestacional y preeclampsia por lo general, desaparecen despus del nacimiento del beb. La presin arterial probablemente se estabilizar en un perodo de 6 semanas. Las mujeres que sufren de hipertensin durante el embarazo tienen una mayor probabilidad de desarrollar hipertensin en etapas posteriores de la vida o en embarazos futuros. FACTORES DE RIESGO Existen ciertos factores que aumentan las probabilidades de que desarrolle hipertensin durante el embarazo. Estos incluyen:  Tener hipertensin arterial antes del embarazo.  Haber sufrido hipertensin arterial durante un embarazo anterior.  Tener sobrepeso.  Ser mayor de 40 aos.  Estar embarazada de ms de un beb.  Tener diabetes o problemas renales. SIGNOS Y SNTOMAS La hipertensin arterial gestacional y crnica en  raras ocasiones provoca sntomas. La preeclampsia causa sntomas, que pueden ser:  Aumento de las protenas en la orina. El mdico la controlar en cada visita prenatal.  Hinchazn de las manos y la cara.  Aumento rpido de peso.  Dolores de cabeza.  Cambios en la visin.  Molestias al ver la luz.  Dolor abdominal, especialmente en el rea superior derecha.  Dolor en el pecho.  Falta de aire.  Aumento de los reflejos.  Convulsiones. Las convulsiones ocurren en una forma ms grave de preeclampsia, llamada eclampsia. DIAGNSTICO  Es posible que se le diagnostique hipertensin arterial durante un control prenatal regular. En cada visita prenatal, es posible que le realicen los siguientes exmenes:  Control de la presin arterial.  Anlisis de orina para detectar protenas en la orina. El tipo de hipertensin que se diagnostica depende del momento en que se desarroll. Tambin depende de la lectura de su presin arterial especfica.  El desarrollo de hipertensin arterial antes de lasemana 20 de embarazo es consecuente con la hipertensin arterial crnica.  El desarrollo de hipertensin arterial despus de la semana 20 de embarazo es consecuente con la hipertensin gestacional.  Hipertensin con aumento de la protena urinaria se diagnostica como preeclampsia.  Las mediciones de la presin arterial de ms de 160 sistlica o 110 diastlica son un signo de preeclampsia grave. TRATAMIENTO El tratamiento para la hipertensin durante el embarazo vara. Depende del tipo de hipertensin y de su gravedad.  Si toma medicamentos para la hipertensin crnica, puede que tenga que cambiarlos.  Los medicamentos llamados inhibidores de la ECA no deben tomarse durante el embarazo.  Para las mujeres que tienen factores de riesgo de preeclampsia pueden recomendarse bajas dosis de aspirina.  Si usted tiene   hipertensin gestacional, tendr que tomar un medicamento para la presin arterial que  sea seguro durante el embarazo. El mdico le recomendar el medicamento apropiado.  Si tiene preeclampsia grave, es posible que tenga que permanecer en el hospital. Los mdicos la controlarn a usted y al beb muy de cerca. Probablemente deba tomar un medicamento denominado sulfato de magnesio para prevenir las convulsiones y reducir la presin arterial.  A veces es necesario inducir un parto prematuro. Por ejemplo, si la afeccin empeora. Se hace para protegerlos a usted y a su beb. La nica cura para la preeclampsia es el parto.  Su mdico puede recomendarle que tome una aspirina de dosis baja (81mg) cada da, a fin de ayudar a prevenir la hipertensin durante el embarazo, si est en riesgo de padecer preeclampsia. Puede estar en riesgo de padecer preeclampsia si:  Padeci preeclampsia o eclampsia durante un embarazo anterior.  Su beb no creci segn lo previsto durante un embarazo anterior.  Tuvo un parto prematuro en un embarazo anterior.  Experiment una separacin de la placenta desde el tero (desprendimiento abrupto de la placenta) durante un embarazo anterior.  Perdi un beb en un embarazo anterior.  Est embarazada de ms de un beb.  Padece otras afecciones mdicas, como diabetes o una enfermedad autoinmunitaria. INSTRUCCIONES PARA EL CUIDADO EN EL HOGAR  Programe y concurra a todas las citas de control prenatal regulares. Esto es importante.  Tome los medicamentos solamente como se lo haya indicado el mdico. Dgale a su mdico todos los medicamentos que toma.  Consuma la menor cantidad posible de sal.  Realice actividad fsica con regularidad.  No beba alcohol.  No fume ni use productos que contengan tabaco.  No beba productos con cafena.  Acustese sobre el lado izquierdo cuando haga reposo. SOLICITE ATENCIN MDICA DE INMEDIATO SI:  Siente un dolor abdominal intenso.  Presenta hinchazn repentina en las manos, los tobillos o el rostro.  Aumenta ms de 4  libras (1,8 kg) en una semana.  Vomita repetidas veces.  Tiene una hemorragia vaginal abundante.  No siente que el beb se mueva mucho.  Tiene dolores de cabeza.  Tiene visin doble o borrosa.  Tiene calambres o espasmos musculares.  Le falta el aire.  Tiene los labios y las uas de los dedos de las manos de color azul.  Observa sangre en la orina. ASEGRESE DE QUE:  Comprende estas instrucciones.  Controlar su afeccin.  Recibir ayuda de inmediato si no mejora o si empeora. Document Released: 08/21/2011 Document Revised: 01/16/2014 [employer] Patient Information 2015 [employer]. This information is not intended to replace advice given to you by your health care provider. Make sure you discuss any questions you have with your health care provider.  

## 2014-08-22 NOTE — Progress Notes (Signed)
Ivonne AndrewV. Smith CNM and Marly, in-house spanish interpreter, in to discuss test results and d/c plan. Written and verbal d/c instructions given and understanding voiced. Preeclampsia information given.

## 2014-08-25 ENCOUNTER — Ambulatory Visit (INDEPENDENT_AMBULATORY_CARE_PROVIDER_SITE_OTHER): Payer: Medicaid Other | Admitting: Family Medicine

## 2014-08-25 ENCOUNTER — Encounter: Payer: Self-pay | Admitting: Family Medicine

## 2014-08-25 VITALS — BP 128/67 | HR 79 | Temp 97.5°F | Wt 200.8 lb

## 2014-08-25 DIAGNOSIS — R7989 Other specified abnormal findings of blood chemistry: Secondary | ICD-10-CM

## 2014-08-25 DIAGNOSIS — R945 Abnormal results of liver function studies: Principal | ICD-10-CM

## 2014-08-25 LAB — COMPREHENSIVE METABOLIC PANEL
ALT: 38 U/L — AB (ref 0–35)
AST: 22 U/L (ref 0–37)
Albumin: 3.6 g/dL (ref 3.5–5.2)
Alkaline Phosphatase: 87 U/L (ref 39–117)
BUN: 16 mg/dL (ref 6–23)
CALCIUM: 9.1 mg/dL (ref 8.4–10.5)
CHLORIDE: 103 meq/L (ref 96–112)
CO2: 24 mEq/L (ref 19–32)
Creat: 0.56 mg/dL (ref 0.50–1.10)
GLUCOSE: 86 mg/dL (ref 70–99)
Potassium: 4.5 mEq/L (ref 3.5–5.3)
SODIUM: 138 meq/L (ref 135–145)
TOTAL PROTEIN: 6.2 g/dL (ref 6.0–8.3)
Total Bilirubin: 0.2 mg/dL (ref 0.2–1.2)

## 2014-08-25 NOTE — Progress Notes (Signed)
Spanish Interpreter Hannah [name] present for encounter

## 2014-08-25 NOTE — Patient Instructions (Signed)
Preeclampsia y eclampsia (Preeclampsia and Eclampsia) La preeclampsia es una afeccin grave que solo se manifiesta durante el embarazo. Tambin se la conoce como toxemia del embarazo. Esta afeccin provoca el aumento de la presin arterial junto con otros sntomas, como hinchazn y dolores de cabeza, que pueden aparecer a medida que la preeclampsia empeora. La preeclampsia puede presentarse a las 20semanas de gestacin o posteriormente.  El diagnstico y el tratamiento tempranos de esta afeccin son muy importantes. Si no se la trata de inmediato, puede causarles problemas graves a usted y al beb. Una complicacin que la preeclampsia puede provocar es la eclampsia, una afeccin que causa temblores o contracciones musculares (convulsiones) en la madre. Provocar el parto del beb es el mejor tratamiento para la preeclampsia o la eclampsia.  FACTORES DE RIESGO La causa de la preeclampsia no se conoce. Puede tener ms probabilidades de desarrollar la afeccin si tiene ciertos factores de riesgo. Estos incluyen:   Estar embarazada por primera vez.  Haber tenido preeclampsia en un embarazo anterior.  Tener antecedentes familiares de preeclampsia.  Tener presin arterial alta.  Estar embarazada de gemelos o trillizos.  Tener 35aos o ms.  Pertenecer a la raza afroamericana.  Tener enfermedad renal o diabetes.  Tener enfermedades, como lupus o enfermedades de la sangre.  Tener mucho sobrepeso (obesidad). SIGNOS Y SNTOMAS  Los primeros signos de preeclampsia son:  Hipertensin arterial.  Aumento de las protenas en la orina. El mdico la controlar en cada visita prenatal. Otros sntomas que pueden presentarse incluyen:   Dolor de cabeza intenso  Aumento repentino de peso.  Hinchazn de las manos, el rostro, las piernas y los pies.  Malestar estomacal (nuseas) y (vmitos).  Problemas visuales (visin doble o borrosa).  Adormecimiento del rostro, los brazos, las piernas y los  pies.  Mareos.  Hablar arrastrando las palabras.  Sensibilidad a las luces brillantes.  Dolor abdominal. DIAGNSTICO  No hay pruebas diagnsticas para la preeclampsia. El mdico le har preguntas sobre los sntomas y verificar la presencia de signos de preeclampsia durante las visitas prenatales. Tambin pueden hacerle exmenes que incluyen:  Anlisis de orina.  Anlisis de sangre.  Control de la frecuencia cardaca del beb.  Control de la salud del beb y de la placenta mediante el uso de imgenes que se generan con ondas sonoras (ecografa). TRATAMIENTO  Puede planificar el mejor abordaje de tratamiento junto con el mdico. Es muy importante que concurra a todas las visitas prenatales. Si tiene un riesgo ms alto de tener preeclampsia, tal vez necesite exmenes prenatales con ms frecuencia.  El mdico tambin puede indicarle que haga reposo en cama.  Tal vez deba comer la menor cantidad de sal posible.  Es posible que tenga que tomar medicamentos para bajar la presin arterial si la afeccin no responde a medidas ms conservadoras.  Quizs deba permanecer en el hospital si la afeccin es grave. All, el tratamiento estar centrado en el control de la presin arterial y la retencin de lquidos. Puede que tambin tenga que tomar medicamentos para evitar las convulsiones.  Si la afeccin empeora, tal vez sea necesario adelantar el parto para protegerlos a usted y al beb. Pueden provocarle el trabajo de parto con medicamentos (inducirse) o hacerle una cesrea.  Generalmente, la preeclampsia desaparece despus del parto. INSTRUCCIONES PARA EL CUIDADO EN EL HOGAR   Tome los medicamentos de venta libre o recetados solamente segn las indicaciones del mdico.  Acustese sobre el lado izquierdo mientras hace reposo; de este modo, se quita la   presin del beb.  Levante los pies mientras hace reposo.  Realice actividad fsica con regularidad. Consulte al mdico qu tipo de  ejercicio es seguro para usted.  Evite la cafena y el alcohol.  No fume.  Beba de 6 a 8vasos de agua por da.  Coma una dieta equilibrada con bajo contenido de sal. No agregue sal a las comidas.  Evite las situaciones estresantes tanto como sea posible.  Descanse y duerma lo suficiente.  Concurra a todas las visitas prenatales y hgase todos los anlisis segn lo programado. SOLICITE ATENCIN MDICA SI:  Aumenta de peso ms de lo esperado.  Tiene dolores de cabeza, dolor abdominal o nuseas.  Aparecen ms hematomas que lo normal.  Se siente mareada o tiene vahdos. SOLICITE ATENCIN MDICA DE INMEDIATO SI:   Aparece una hinchazn repentina o tiene una zona muy hinchada en cualquier parte del cuerpo. Esto suele ocurrir en las piernas.  Tiene un aumento de peso de 5libras (2,3kg) o ms en una semana.  Tiene dolor de cabeza intenso, mareos, problemas visuales o confusin.  Siente un dolor abdominal intenso.  Tiene nuseas o vmitos permanentes.  Tiene convulsiones.  Tiene dificultad para mover cualquier parte del cuerpo.  Tiene adormecimiento en el cuerpo.  Tiene dificultad para hablar.  Tiene cualquier sangrado anormal.  Siente rigidez en el cuello.  Se desmaya. ASEGRESE DE QUE:   Comprende estas instrucciones.  Controlar su afeccin.  Recibir ayuda de inmediato si no mejora o si empeora. Document Released: 06/11/2005 Document Revised: 09/06/2013 [employer] Patient Information 2015 [employer]. This information is not intended to replace advice given to you by your health care provider. Make sure you discuss any questions you have with your health care provider.  

## 2014-08-25 NOTE — Progress Notes (Signed)
    Subjective:    Patient ID: Hannah [name] is a 28 y.o. female presenting with Follow-up  on 08/25/2014  Spanish interpreter: [place]amAudriana used  HPI: ED f/u after coming in with swelling. Negative w/u for DVT.  Had PIH labs and some mild elevation of LFT's. Swelling is improved.  No SOB.  Review of Systems  Constitutional: Negative for fever and chills.  Respiratory: Negative for shortness of breath.   Cardiovascular: Negative for chest pain.  Gastrointestinal: Negative for nausea, vomiting and abdominal pain.  Genitourinary: Negative for dysuria.  Skin: Negative for rash.      Objective:    BP 128/67 mmHg  Pulse 79  Temp(Src) 97.5 F (36.4 C)  Wt 200 lb 12.8 oz (91.082 kg)  LMP 11/28/2013  Breastfeeding? Yes Physical Exam  Constitutional: She is oriented to person, place, and time. She appears well-developed and well-nourished. No distress.  HENT:  Head: Normocephalic and atraumatic.  Eyes: No scleral icterus.  Neck: Neck supple.  Cardiovascular: Normal rate.   Pulmonary/Chest: Effort normal.  Abdominal: Soft.  Musculoskeletal:  Legs are 1.5 cm apart.  Neg Homman's  Neurological: She is alert and oriented to person, place, and time.  Skin: Skin is warm and dry.  Psychiatric: She has a normal mood and affect.        Assessment & Plan:   Problem List Items Addressed This Visit    None    Visit Diagnoses    Elevated LFTs    -  Primary    unclear etiology    Relevant Orders       Comprehensive metabolic panel       Return if symptoms worsen or fail to improve.

## 2014-09-01 ENCOUNTER — Encounter: Payer: Self-pay | Admitting: *Deleted

## 2014-09-18 ENCOUNTER — Encounter: Payer: Self-pay | Admitting: *Deleted

## 2015-10-15 ENCOUNTER — Ambulatory Visit
Admission: RE | Admit: 2015-10-15 | Discharge: 2015-10-15 | Disposition: A | Discharge status: Home / Self Care | Payer: No Typology Code available for payment source | Source: Ambulatory Visit | Attending: Infectious Disease | Admitting: Infectious Disease

## 2015-10-15 ENCOUNTER — Other Ambulatory Visit: Payer: Self-pay | Admitting: Infectious Disease

## 2015-10-15 DIAGNOSIS — R7611 Nonspecific reaction to tuberculin skin test without active tuberculosis: Secondary | ICD-10-CM

## 2015-10-20 IMAGING — CR DG CHEST 2V
2 series · 2 of 2 positions shown · non-contrast
Comparison: None.

CLINICAL DATA: TB exposure

EXAM:
CHEST  2 VIEW

[view not recorded (1 of 2)]
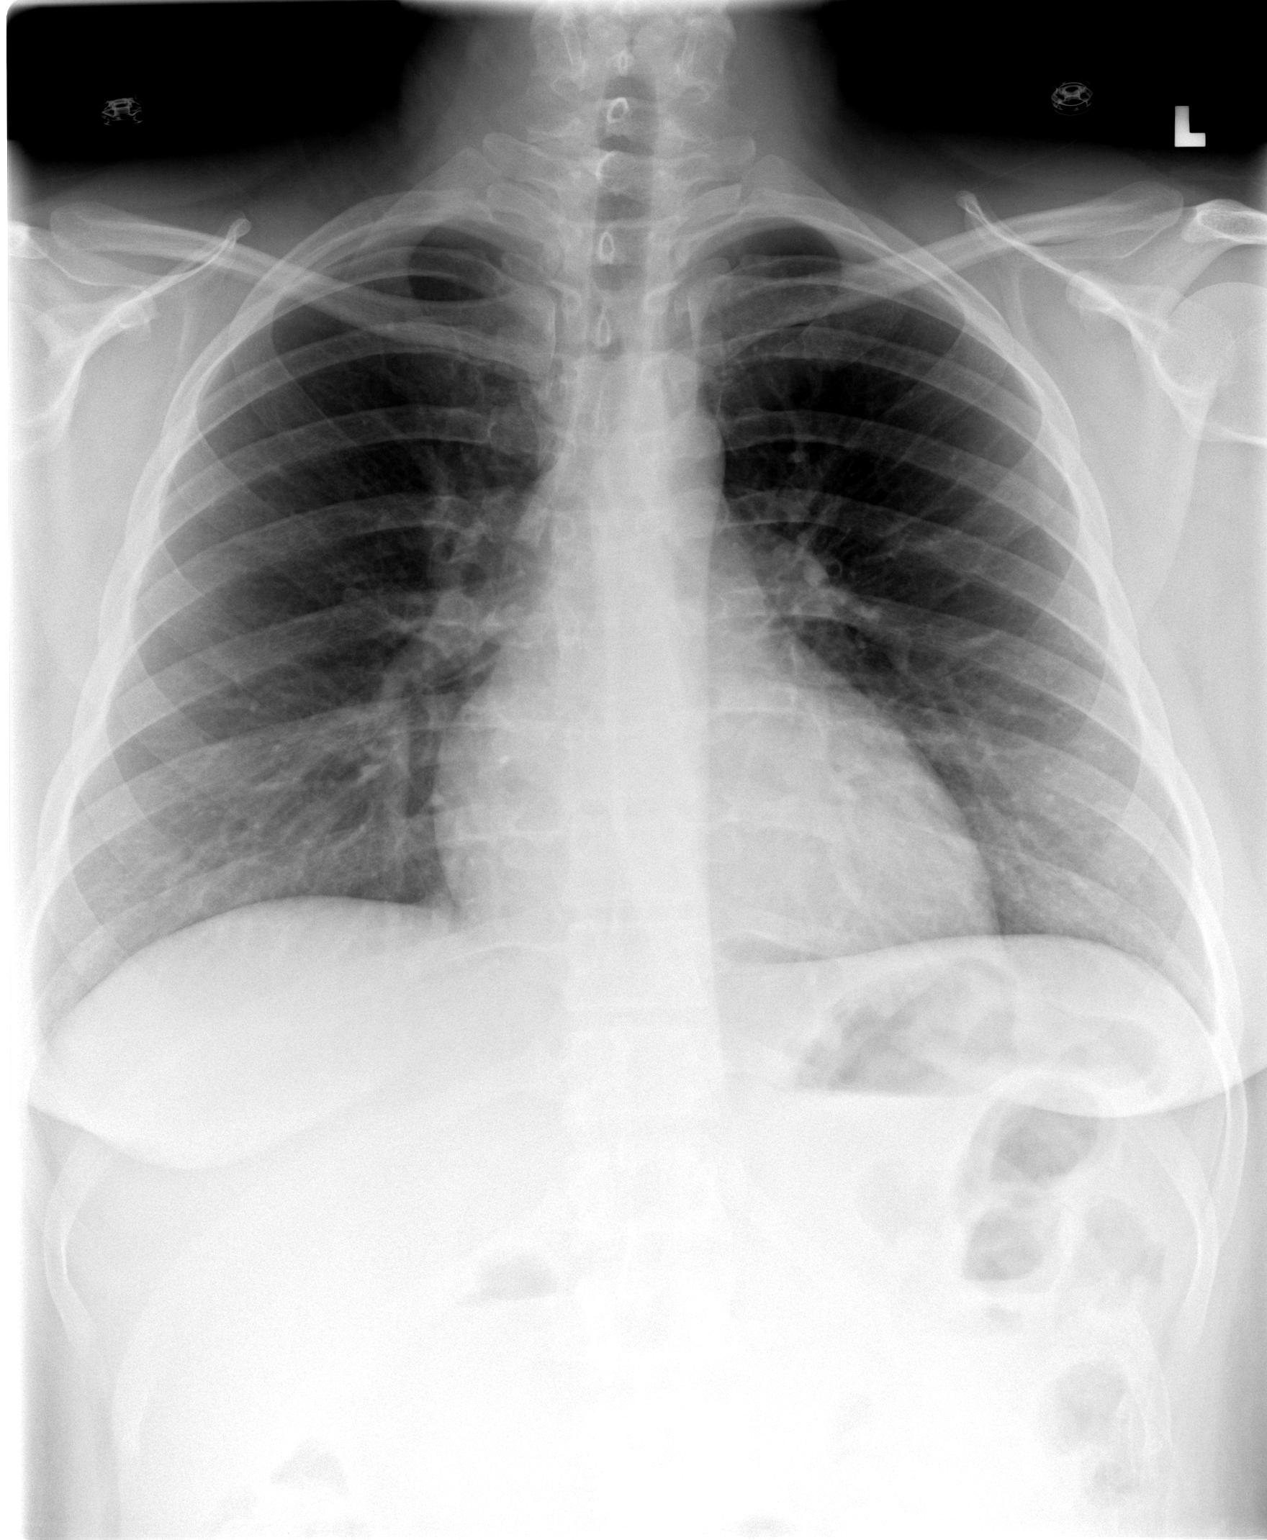

[view not recorded (2 of 2)]
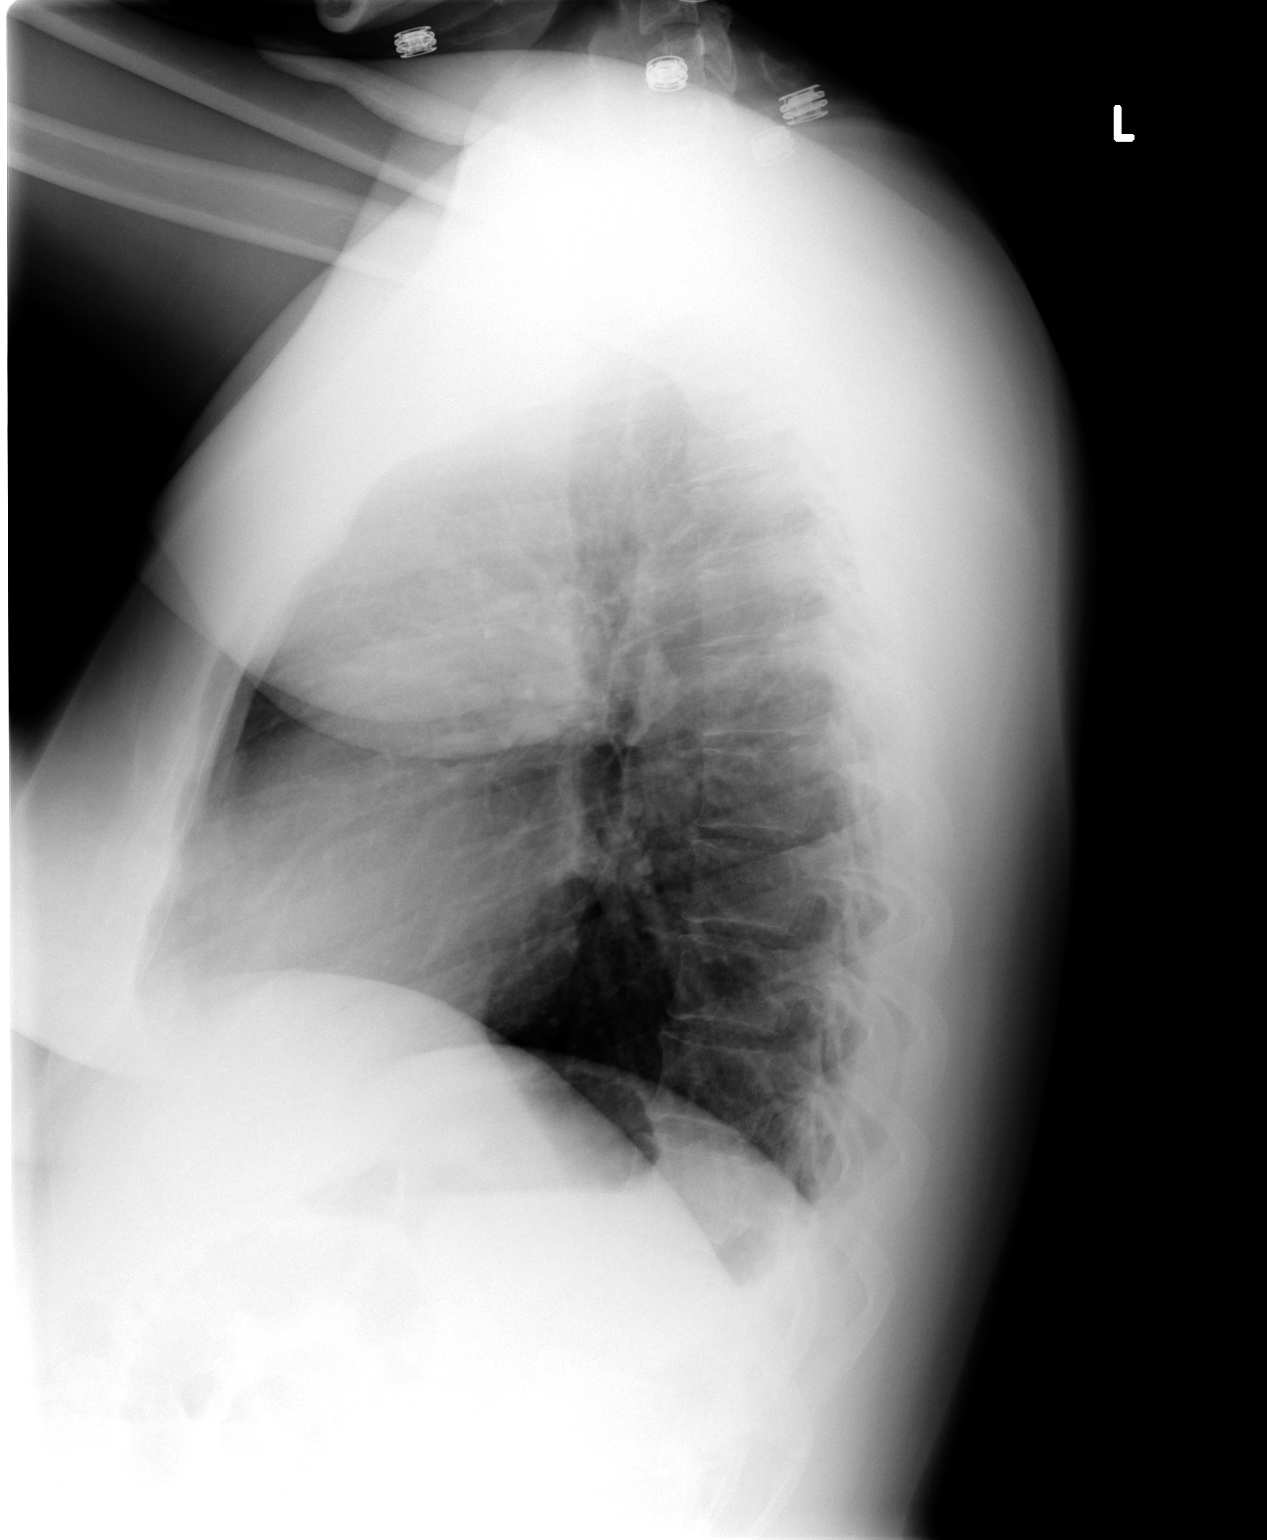

[2 of 2 positions shown; findings below may reference images not displayed]

FINDINGS: Lungs are clear.  No pleural effusion or pneumothorax.

The heart is normal in size.

Visualized osseous structures are within normal limits.
IMPRESSION: Normal chest radiographs.

## 2016-11-10 ENCOUNTER — Encounter: Payer: Self-pay | Admitting: Licensed Clinical Social Worker

## 2016-11-10 ENCOUNTER — Ambulatory Visit: Payer: Medicaid Other | Attending: Internal Medicine | Admitting: Internal Medicine

## 2016-11-10 ENCOUNTER — Encounter: Payer: Self-pay | Admitting: Internal Medicine

## 2016-11-10 VITALS — BP 114/79 | HR 69 | Temp 98.0°F | Resp 16 | Wt 191.2 lb

## 2016-11-10 DIAGNOSIS — R42 Dizziness and giddiness: Secondary | ICD-10-CM | POA: Insufficient documentation

## 2016-11-10 DIAGNOSIS — M7989 Other specified soft tissue disorders: Secondary | ICD-10-CM | POA: Diagnosis not present

## 2016-11-10 DIAGNOSIS — F329 Major depressive disorder, single episode, unspecified: Secondary | ICD-10-CM

## 2016-11-10 DIAGNOSIS — Z131 Encounter for screening for diabetes mellitus: Secondary | ICD-10-CM

## 2016-11-10 DIAGNOSIS — Z23 Encounter for immunization: Secondary | ICD-10-CM | POA: Diagnosis not present

## 2016-11-10 DIAGNOSIS — Z79899 Other long term (current) drug therapy: Secondary | ICD-10-CM | POA: Diagnosis not present

## 2016-11-10 DIAGNOSIS — R51 Headache: Secondary | ICD-10-CM | POA: Insufficient documentation

## 2016-11-10 DIAGNOSIS — F418 Other specified anxiety disorders: Secondary | ICD-10-CM | POA: Diagnosis not present

## 2016-11-10 DIAGNOSIS — F439 Reaction to severe stress, unspecified: Secondary | ICD-10-CM | POA: Diagnosis not present

## 2016-11-10 DIAGNOSIS — F419 Anxiety disorder, unspecified: Secondary | ICD-10-CM | POA: Insufficient documentation

## 2016-11-10 DIAGNOSIS — Z0001 Encounter for general adult medical examination with abnormal findings: Secondary | ICD-10-CM | POA: Diagnosis not present

## 2016-11-10 DIAGNOSIS — F32A Depression, unspecified: Secondary | ICD-10-CM

## 2016-11-10 DIAGNOSIS — R519 Headache, unspecified: Secondary | ICD-10-CM

## 2016-11-10 LAB — CBC WITH DIFFERENTIAL/PLATELET
BASOS ABS: 0 {cells}/uL (ref 0–200)
Basophils Relative: 0 %
Eosinophils Absolute: 288 cells/uL (ref 15–500)
Eosinophils Relative: 4 %
HEMATOCRIT: 40.3 % (ref 35.0–45.0)
Hemoglobin: 13.2 g/dL (ref 11.7–15.5)
Lymphocytes Relative: 31 %
Lymphs Abs: 2232 cells/uL (ref 850–3900)
MCH: 28 pg (ref 27.0–33.0)
MCHC: 32.8 g/dL (ref 32.0–36.0)
MCV: 85.4 fL (ref 80.0–100.0)
MONO ABS: 360 {cells}/uL (ref 200–950)
MONOS PCT: 5 %
MPV: 9.4 fL (ref 7.5–12.5)
NEUTROS ABS: 4320 {cells}/uL (ref 1500–7800)
Neutrophils Relative %: 60 %
PLATELETS: 311 10*3/uL (ref 140–400)
RBC: 4.72 MIL/uL (ref 3.80–5.10)
RDW: 14.1 % (ref 11.0–15.0)
WBC: 7.2 10*3/uL (ref 3.8–10.8)

## 2016-11-10 LAB — CMP AND LIVER
ALT: 22 U/L (ref 6–29)
AST: 16 U/L (ref 10–30)
Albumin: 4.8 g/dL (ref 3.6–5.1)
Alkaline Phosphatase: 56 U/L (ref 33–115)
BILIRUBIN TOTAL: 0.5 mg/dL (ref 0.2–1.2)
BUN: 15 mg/dL (ref 7–25)
Bilirubin, Direct: 0.1 mg/dL (ref <=0.2)
CO2: 25 mmol/L (ref 20–31)
Calcium: 9.7 mg/dL (ref 8.6–10.2)
Chloride: 105 mmol/L (ref 98–110)
Creat: 0.68 mg/dL (ref 0.50–1.10)
Glucose, Bld: 86 mg/dL (ref 65–99)
Indirect Bilirubin: 0.4 mg/dL (ref 0.2–1.2)
Potassium: 4.1 mmol/L (ref 3.5–5.3)
SODIUM: 139 mmol/L (ref 135–146)
TOTAL PROTEIN: 7.4 g/dL (ref 6.1–8.1)

## 2016-11-10 LAB — LIPID PANEL
CHOL/HDL RATIO: 4.6 ratio (ref <5.0)
Cholesterol: 169 mg/dL (ref <200)
HDL: 37 mg/dL — ABNORMAL LOW (ref >50)
LDL Cholesterol: 108 mg/dL — ABNORMAL HIGH (ref <100)
Triglycerides: 121 mg/dL (ref <150)
VLDL: 24 mg/dL (ref <30)

## 2016-11-10 LAB — POCT GLYCOSYLATED HEMOGLOBIN (HGB A1C): HEMOGLOBIN A1C: 5.3

## 2016-11-10 LAB — TSH: TSH: 0.68 mIU/L

## 2016-11-10 MED ORDER — ESCITALOPRAM OXALATE 20 MG PO TABS: 20.0000 mg | ORAL_TABLET | Freq: Every day | ORAL | 1 refills | Status: DC

## 2016-11-10 NOTE — BH Specialist Note (Signed)
Session Start time: 9:10 AM   End Time: 9:50 AM Total Time:  40 minutes Type of Service: Behavioral Health - Individual/Family Interpreter: Yes.     Interpreter Name & Language: Onalee HuaDavid [phone]([id number]) Spanish # [facility]   SUBJECTIVE: Hannah SolomonsSonia Sanchez [name] is a 31 y.o. female  Pt. was referred by Dr. Julien NordmannLangeland for:  anxiety and depression. Pt. reports the following symptoms/concerns: overwhelming feelings of sadness and worry, difficulty sleeping, low energy, snacking/overeating throughout the day, and inability to focus Duration of problem:  2 years Pt reported that she was diagnosed with depression and anxiety after the birth of her youngest child  Severity: severe Previous treatment: Pt reported that she was open to services in the past; however, was unable to find an agency to work with   OBJECTIVE: Mood: Anxious & Affect: Depressed and Tearful Risk of harm to self or others: Pt denied SI/HI/AVH She reports that she and husband has a safety plan for her and the children should she have thoughts of harming herself or others Assessments administered: PHQ-9; GAD-7  LIFE CONTEXT:  Family & Social: Pt resides with her husband and their four minor children, ages 6112, 3811, 278, and 2 School/ Work: Pt receives [employer]IC and [employer]icaid. Her husband is employed Self-Care: No report of substance use Life changes: Pt is experiencing increased anxiety and depression triggered by fear of spouse being deported and having no additional support system. She has two children with special needs What is important to pt/family (values): Family, Spirituality, and Good Health   GOALS ADDRESSED:  Decrease symptoms of depression Decrease symptoms of anxiety Increase knowledge of coping skills Increase adequate support system for patient and family  INTERVENTIONS: Solution Focused, Strength-based and Supportive   ASSESSMENT:  Pt currently experiencing depression and anxiety triggered by  increased stress from caring for two special needs children, in addition, to ongoing fear that spouse will be deported. Pt reports overwhelming feelings of sadness and worry, difficulty sleeping, low energy, snacking/overeating throughout the day, and inability to focus. She has limited support. Pt may benefit from psychoeducation, psychotherapy, and medication management. LCSWA provided education on anxiety and depression. LCSWA discussed benefits of applying healthy coping skills to decrease symptoms and pt identified coping strategies to utilize on a daily basis. Pt is open to initiating therapy and medication management. LCSWA provided pt with resources for crisis intervention, advocacy and citizenship support services. A referral for psychotherapy was completed for [facility].      PLAN: 1. F/U with behavioral health clinician: Pt was encouraged to contact LCSWA if symptoms worsen or fail to improve to [facility] 2. Behavioral Health meds: Lexapro 3. Behavioral recommendations: LCSWA recommends that pt apply healthy coping skills discussed, initiate therapy, comply with medication, and utilize resources as needed. Pt is encouraged to schedule follow up appointment with LCSWA 4. Referral: Brief Counseling/Psychotherapy, [facility] CorporationCommunity Resource, Problem-solving teaching/coping strategies, Psychoeducation, Supportive Counseling and Referral to Counselor/Psychotherapist 5. From scale of 1-10, how likely are you to follow plan: 9/10   Bridgett LarssonJasmine D Victorine Mcnee, MSW, [facility]WA  Clinical Social Worker 11/11/16 9:34 AM  Warmhandoff:   Warm Hand Off Completed.

## 2016-11-10 NOTE — Patient Instructions (Addendum)
Trastorno Tour manager (Major Depressive Disorder) El trastorno depresivo mayor es una enfermedad mental. Tambin se llamado depresin clnica o depresin unipolar. Produce sentimientos de tristeza, desesperanza o desamparo. Algunas personas con trastorno depresivo mayor no se sienten particularmente tristes, pero pierden Librarian, academic las cosas que solan disfrutar (anhedonia). Tambin puede causar sntomas fsicos. Interfiere en el trabajo, la escuela, las relaciones y otras actividades diarias normales. Puede variar en gravedad, pero es ms duradera y ms grave que la tristeza que todos sentimos de vez en cuando en nuestras vidas.  Muchas veces es desencadenada por sucesos estresantes o cambios importantes en la vida. Algunos ejemplos de estos factores desencadenantes son el divorcio, la prdida del trabajo o el hogar, [place], y la muerte de un familiar o amigo cercano. A veces aparece sin ninguna razn evidente. Las personas que tienen familiares con depresin mayor o con trastorno bipolar tienen ms riesgo de desarrollar depresin mayor con o sin factores de Psychologist, forensic. Puede ocurrir a Hotel manager. Puede ocurrir slo una vez en su vida(episodio nico de trastorno depresivo mayor). Puede ocurrir varias veces (trastorno depresivo mayor recurrente).  SNTOMAS  Las personas con trastorno depresivo mayor presentan anhedonia o estado de nimo deprimido casi [employer] durante al menos 2 semanas o ms. Los sntomas son:   Sensacin de tristeza Secretary/administrator) o vaco.  Sentimientos de desesperanza o desamparo.  Lagrimeo o episodios de llanto ( es observado por los dems).  Irritabilidad (en nios y adolescentes). Adems del estado de nimo deprimido o la anhedonia o ambos, estos enfermos tienen al menos cuatro de los siguientes sntomas :   Dificultad para dormir o Aeronautical engineer.   Cambio significativo (aumento o disminucin) en el apetito o Financial controller.   Falta de energa o  motivacin.  Sentimientos de culpa o desvalorizacin.   Dificultad para concentrarse, recordar o tomar decisiones.  Movimientos inusualmente lentos (retardo psicomotor retardation) o inquietud (segn lo observado por los dems).   Deseos recurrentes de muerte, pensamientos recurrentes de autoagresin (suicidio) o intento de suicidio. Las personas con trastorno depresivo mayor suelen tener pensamientos persistentes negativos acerca de s mismos, de otras personas y del mundo. Las personas con trastorno depresivo mayor grave pueden experimentar creencias o percepciones distorsionadas sobre el mundo (delirios psicticos). Tambin pueden ver u or cosas que no son reales(alucinaciones psicticas).  DIAGNSTICO  El diagnstico se realiza mediante una evaluacin hecha por el mdico. El mdico le preguntar acerca de los aspectos de su vida cotidiana, como el [place] de nimo, el sueo y el apetito, para ver si usted tiene los sntomas de Environmental education officer. Le har preguntas sobre su historial mdico y el consumo de alcohol o drogas, incluyendo medicamentos recetados. [employer] un examen fsico y Charity fundraiser anlisis de [place]. Esto se debe a que ciertas enfermedades y el uso de determinadas sustancias pueden causar sntomas similares a la depresin (depresin secundaria). Su mdico tambin podra derivarlo a Location manager salud mental para Ardelia Mems evaluacin y [place].  TRATAMIENTO  Es Glass blower/designer los sntomas y Geographical information systems officer. Los siguientes tratamientos pueden indicarse:    Medicamentos - Generalmente se recetan antidepresivos. Los antidepresivos se piensa que corrigen los desequilibrios qumicos en el cerebro que se asocian comnmente a la depresin Chiropractor. Se pueden agregar otros tipos de medicamentos si los sntomas no responden a los antidepresivos solos o si hay ideas delirantes o alucinaciones psicticas.  Psicoterapia - ciertos tipos de psicoterapia pueden ser tiles en el  tratamiento del trastorno de  la depresin mayor, proporcionando apoyo, educacin y orientacin. Ciertos tipos de psicoterapia tambin pueden ayudar a superar los pensamientos negativos (terapia cognitivo conductual) y a problemas de relacin que desencadena la depresin mayor (terapia interpersonal). Un especialista en salud mental puede ayudarlo a determinar qu tratamiento es el mejor para usted. La Harley-Davidson de los pacientes mejoran con una combinacin de medicacin y psicoterapia. Los tratamientos que implican la estimulacin elctrica del cerebro pueden ser utilizados en situaciones con sntomas muy graves o cuando los medicamentos y la psicoterapia no funcionan despus de un [place]. Estos tratamientos incluyen terapia electroconvulsiva, estimulacin magntica transcraneal y estimulacin del nervio vago.  Esta informacin no tiene Theme park manager el consejo del mdico. Asegrese de hacerle al mdico cualquier pregunta que tenga. Document Released: 12/27/2012 Document Revised: 09/22/2014 Elsevier Interactive Patient Education  2017 [employer]vin[employer]itor.   -  Trastorno de ansiedad generalizada (Generalized Anxiety Disorder) El trastorno de ansiedad generalizada es un trastorno mental. Interfiere en las funciones vitales, incluyendo las [place], el trabajo y la escuela.  Es diferente de la ansiedad normal que todas las personas experimentan en algn momento de su vida en respuesta a sucesos y Chief Operating Officer. En verdad, la ansiedad normal nos ayuda a prepararnos y Human resources officer acontecimientos y actividades de la vida. La ansiedad normal desaparece despus de que el evento o la actividad ha finalizado.  El trastorno de ansiedad generalizada no est necesariamente relacionada con eventos o actividades especficas. Tambin causa un exceso de ansiedad en proporcin a sucesos o actividades especficas. En este trastorno la ansiedad es difcil de Chief Operating Officer. Los sntomas pueden variar de leves a muy  graves. Las personas que sufren de trastorno de ansiedad generalizada pueden tener intensas olas de ansiedad con sntomas fsicos (ataques de pnico).  SNTOMAS  La ansiedad y la preocupacin asociada a este trastorno son difciles de Chief Operating Officer. Esta ansiedad y la preocupacin estn relacionados con muchos eventos de la vida y sus actividades y tambin ocurre durante ms [employer] que no ocurre, durante 6 meses o ms. Las personas que la sufren pueden tener tres o ms de los siguientes sntomas (uno o ms en los nios):   Glass blower/designer.  Dificultades de concentracin.   Irritabilidad.  Tensin muscular  Dificultad para dormirse o sueo poco satisfactorio. DIAGNSTICO  Se diagnostica a travs de una evaluacin realizada por el mdico. El mdico le har preguntas acerca de su estado de nimo, sntomas fsicos y sucesos de [place] vida. Le har preguntas sobre su historia clnica, el consumo de alcohol o drogas, incluyendo los medicamentos recetados. [employer] un examen fsico e indicar anlisis de [place]. Ciertas enfermedades y el uso de determinadas sustancias pueden causar sntomas similares a este trastorno. Su mdico lo puede derivar a Music therapist en salud mental para una evaluacin ms profunda.Gerlean Ren  Las terapias siguientes se utilizan en el tratamiento de este trastorno:   Medicamentos - Se recetan antidepresivos para el control diario a Air cabin crew. Pueden indicarse tambin medicamentos para combatir la [employer] graves, especialmente cuando ocurren ataques de pnico.   Terapia conversada (psicoterapia) Ciertos tipos de psicoterapia pueden ser tiles en el tratamiento del trastorno de ansiedad generalizada, proporcionando apoyo, educacin y Optometrist. Una forma de psicoterapia llamada terapia cognitivo-conductual puede ensearle formas saludables de pensar y Publishing rights manager a los eventos y actividades de la vida diaria.  Tcnicasde manejo del estrs-  Estas tcnicas incluyen el yoga, la meditacin y el ejercicio y pueden ser muy tiles cuando  se practican con regularidad. Un especialista en salud mental puede ayudar a determinar qu tratamiento es mejor para usted. Algunas personas obtienen mejora con una terapia. Sin embargo, Economist requieren una combinacin de terapias.  Esta informacin no tiene Theme park manager el consejo del mdico. Asegrese de hacerle al mdico cualquier pregunta que tenga. Document Released: 12/27/2012 Document Revised: 09/22/2014 Elsevier Interactive Patient Education  2017 Arvin[employer]itor. Madilyn Fireman Td (contra la difteria y el ttanos): Lo que debe saber (Td Vaccine [place] and Diphtheria]: What You Need to Know) 1. Por qu vacunarse? El ttanos y la difteria son enfermedades muy graves. Son Academic librarian frecuentes en los [place] actualmente, pero las personas que se infectan suelen tener complicaciones graves. La vacuna Td se [place] para proteger a los adolescentes y a los adultos de ambas enfermedades. Tanto el ttanos como la difteria son infecciones causadas por bacterias. La difteria se transmite de persona a persona a travs de la tos o el estornudo. La bacteria que causa el ttanos entra al cuerpo a travs de cortes, raspones o heridas. El TTANOS (trismo) provoca entumecimiento y Engineer, materials dolorosa de los msculos, por lo general, en todo el cuerpo.  Puede causar el endurecimiento de los msculos de la cabeza y el cuello, de modo que impide abrir la boca, tragar y en algunos casos, Industrial/product designer. El ttanos es causa de muerte en aproximadamente 1de cada 10personas que contraen la infeccin, incluso despus de que reciben la mejor atencin mdica. La DIFTERIA puede hacer que se forme una membrana gruesa en la parte posterior de la garganta.  Puede causar problemas respiratorios, parlisis, insuficiencia cardaca e incluso la muerte. Antes de las vacunas, en los [place] se informaban 200000 casos de  difteria y cientos de casos de ttanos cada ao. Desde que comenz la vacunacin, los informes de casos de ambas enfermedades se han reducido en un 99%. 2. Madilyn Fireman Td La vacuna Td protege a adolescentes y adultos contra el ttanos y la difteria. La vacuna Td habitualmente se aplica como dosis de refuerzo cada 10aos, pero tambin puede administrarse antes si la persona sufre una [place] o herida sucia y grave. A veces, en lugar de la vacuna Td, se recomienda una vacuna llamada Tdap, que protege contra la tosferina, adems de proteger contra el ttanos y la difteria. El mdico o la persona que le aplique la vacuna puede darle ms informacin al [employer]. La Td puede administrarse de manera segura simultneamente con otras vacunas. 3. Algunas personas no deben recibir la vacuna  Una persona que alguna vez ha tenido una reaccin alrgica potencialmente mortal a una dosis anterior de cualquier vacuna contra el ttanos o la difteria, O que tenga una alergia grave a cualquier parte de esta vacuna, no debe recibir la vacuna Td. Informe a la persona que le aplica la vacuna si usted tiene cualquier alergia grave.  Consulte con su mdico si:  tuvo hinchazn o dolor intenso despus de recibir cualquier vacuna contra la difteria o el ttanos,  alguna vez ha sufrido el sndrome de [place],  no se siente Research scientist (life sciences) en que se ha programado la vacuna. 4. Riesgos de [place] reaccin a la vacuna Con cualquier medicamento, incluyendo las vacunas, existe la posibilidad de que aparezcan efectos secundarios. Suelen ser leves y desaparecen por s solos. Tambin son posibles las reacciones graves, pero en raras ocasiones. La Harley-Davidson de las personas a las que se les aplica la vacuna Td no tienen ningn problema. Problemas leves despus de la vacuna Td: (  No interfirieron en otras actividades)  Dolor en el lugar donde se aplic la vacuna (alrededor de 8de cada 10personas)  Enrojecimiento o Warehouse managerhinchazn en el  lugar donde se aplic la vacuna (alrededor de 1de cada 4personas)  Fiebre leve (poco frecuente)  Dolor de Training and development officercabeza (alrededor de 1de cada 4personas)  Cansancio (alrededor de 1de cada 4personas) Problemas moderados despus de la vacuna Td: (Interfirieron en otras actividades, pero no requirieron atencin mdica)  Fiebre superior a 102F (38,8C) (poco frecuente) Problemas graves despus de la vacuna Td: (Impidieron Education officer, environmentalrealizar las actividades habituales; requirieron atencin mdica)  Buyer, retailHinchazn, dolor intenso, sangrado o enrojecimiento en el brazo en que se aplic la vacuna (poco frecuente). Problemas que podran ocurrir despus de cualquier vacuna:   Las personas a veces se desmayan despus de un procedimiento mdico, incluida la vacunacin. Si permanece sentado o recostado durante 15 minutos puede ayudar a Lubrizol Corporationevitar los desmayos y las lesiones causadas por las cadas. Informe al mdico si se siente mareado, tiene cambios en la visin o zumbidos en los odos.  Algunas personas sienten un dolor intenso en el hombro y tienen dificultad para mover el brazo donde se coloc la vacuna. Esto sucede con muy poca frecuencia.  Cualquier medicamento puede causar una reaccin alrgica grave. Dichas reacciones son Lynnae Sandhoffmuy poco frecuentes con una vacuna (se calcula que menos de 1en un milln de dosis) y se producen de unos minutos a unas horas despus de Arts development officerla administracin. Al igual que con cualquier Automatic Datamedicamento, existe una probabilidad muy remota de que una vacuna cause una lesin grave o la [place]muerte. Se controla permanentemente la seguridad de las vacunas. Para obtener ms informacin, visite: http://floyd.org/www.cdc.gov/vaccinesafety/. 5. Qu pasa si hay una reaccin grave? A qu signos debo estar atento?   Observe todo lo que le preocupe, como signos de una reaccin alrgica grave, fiebre muy alta o comportamiento fuera de lo normal. Los signos de una reaccin alrgica grave pueden incluir ronchas, hinchazn de la cara  y la garganta, dificultad para respirar, latidos cardacos acelerados, mareos y debilidad. Generalmente, estos comenzaran entre unos pocos minutos y algunas horas despus de la vacunacin. Qu debo hacer?   Si usted piensa que se trata de una reaccin alrgica grave o de otra emergencia que no puede esperar, llame al 911 o dirjase al hospital ms cercano. Sino, llame a su mdico.  Despus, la reaccin debe informarse al [id number] Informacin sobre Efectos Adversos de las [place]acunas (Vaccine Adverse Event Reporting System, VAERS). Su mdico puede presentar este informe, o puede hacerlo usted mismo a travs del sitio web de VAERS, en www.vaers.LAgents.nohhs.gov, o llamando al 269-740-36631-(724)713-9774. VAERS no brinda recomendaciones mdicas.  6. SunTrustPrograma Nacional de Compensacin de Daos por American Electric PowerVacunas [place] Shawn[place]sterPrograma Nacional de Compensacin de Daos por Administrator, artsVacunas (National Vaccine Injury Compensation Program, VICP) es un programa federal que fue creado para Patent examinercompensar a las personas que puedan haber sufrido daos al recibir ciertas vacunas. Aquellas personas que consideren que han sufrido un dao como consecuencia de una vacuna y [place]urasquieran saber ms acerca del programa y de cmo presentar Roslynn Ambleuna denuncia, pueden llamar al [phone] o visitar su sitio web en SpiritualWord.atwww.hrsa.gov/vaccinecompensation. Hay un lmite de tiempo para presentar un reclamo de compensacin. 7. Cmo puedo obtener ms informacin?  Consulte a su mdico. Este puede darle el prospecto de la vacuna o recomendarle otras fuentes de informacin.  Comunquese con el servicio de salud de su localidad o [address] estado.  Comunquese con los Centros para Air traffic controllerel Control y la Prevencin de Child psychotherapistnfermedades (Centers for Disease  Control and Prevention , CDC).  Llame al 704-351-4664 (1-800-CDC-INFO).  Visite el sitio Environmental manager en [email or web address]. Declaracin de informacin sobre la vacuna contra la difteria y el ttanos (Td) de los CDC (12/25/15) Esta informacin no tiene  Theme park manager el consejo del mdico. Asegrese de hacerle al mdico cualquier pregunta que tenga. Document Released: 12/18/2008 Document Revised: 09/22/2014 Document Reviewed: 12/25/2015 Elsevier Interactive Patient Education  2017 Arvin[employer]itor.

## 2016-11-10 NOTE — Progress Notes (Addendum)
Hannah [name], is a 31 y.o. female  ZOX:[id number]  [id number]  DOB - [birth year]  CC:  Chief Complaint  Patient presents with  . New Patient (Initial Visit)       HPI: Hannah [name] is a 31 y.o. female here today to establish medical care.  New to our clinic.  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  Sp tubal ligation, has 4 children, ranging 49 to 20 years old. She started crying when she talked about her youngest to me. She states the 33 year old had some medical complications, and still not walking.  She denies tob/etoh.  Co of frequent Ha last 80month when goes to bed, sometimes last 2 weeks has been waking up w/ dizziness and room spinning. Very stressed out at home.  Her husband helps her.  Co of swelling in legs at night as well, trying to cut down on salt. C/o of varicose veins in left leg.  Patient has No headache, No chest pain, No abdominal pain - No Nausea, No new weakness tingling or numbness, No Cough - SOB.    Review of Systems: Per hpi, o/w all systems reviewed and negative.    No Known Allergies Past Medical History:  Diagnosis Date  . Infection    UTI in early preg  . Tuberculosis    Current Outpatient Prescriptions on File Prior to Visit  Medication Sig Dispense Refill  . ibuprofen (ADVIL,MOTRIN) 600 MG tablet Take 1 tablet (600 mg total) by mouth every 6 (six) hours as needed for mild pain, moderate pain or cramping. (Patient not taking: Reported on 11/10/2016) 30 tablet 0  . oxyCODONE-acetaminophen (PERCOCET/ROXICET) 5-325 MG per tablet Take 1-2 tablets by mouth every 4 (four) hours as needed for moderate pain or severe pain. (Patient not taking: Reported on 08/25/2014) 15 tablet 0  . Prenatal Vit-Fe Fumarate-FA (PRENATAL MULTIVITAMIN) TABS tablet Take 1 tablet by mouth daily at 12 noon.     No current facility-administered medications on file prior to visit.    Family History  Problem  Relation Age of Onset  . Hearing loss Neg Hx    Social History   Social History  . Marital status: Married    Spouse name: N/A  . Number of children: N/A  . Years of education: N/A   Occupational History  . Not on file.   Social History Main Topics  . Smoking status: Never Smoker  . Smokeless tobacco: Never Used  . Alcohol use No  . Drug use: No  . Sexual activity: Not Currently    Birth control/ protection: None   Other Topics Concern  . Not on file   Social History Narrative  . No narrative on file    Objective:   Vitals:   11/10/16 0845  BP: 114/79  Pulse: 69  Resp: 16  Temp: 98 F (36.7 C)    Filed Weights   11/10/16 0845  Weight: 191 lb 3.2 oz (86.7 kg)    BP Readings from Last 3 Encounters:  11/10/16 114/79  08/25/14 128/67  08/22/14 140/80    Physical Exam: Constitutional: Patient appears well-developed and well-nourished. No distress. AAOx3, pleasant. HENT: Normocephalic, atraumatic, External right and left ear normal. Oropharynx is clear and moist.  bilat TMs clear. Eyes: Conjunctivae and EOM are normal. PERRL, no scleral icterus. Neck: Normal ROM. Neck supple. No JVD.  CVS: RRR, S1/S2 +, no murmurs, no gallops, no carotid bruit.  Pulmonary: Effort and  breath sounds normal, no stridor, rhonchi, wheezes, rales.  Abdominal: Soft. BS +, no distension, tenderness, rebound or guarding.  Musculoskeletal: Normal range of motion. No edema and no tenderness.   LE: bilat/ no c/c/e, pulses 2+ bilateral.  Noted mild varicose veins in lle. Neuro: Alert. muscle tone coordination wnl. No cranial nerve deficit grossly. Skin: Skin is warm and dry. No rash noted. Not diaphoretic. No erythema. No pallor. Psychiatric: Normal mood and affect. Behavior, judgment, thought content normal.  Lab Results  Component Value Date   WBC 8.0 08/22/2014   HGB 11.2 (L) 08/22/2014   HCT 34.2 (L) 08/22/2014   MCV 87.0 08/22/2014   PLT 276 08/22/2014   Lab Results    Component Value Date   CREATININE 0.56 08/25/2014   BUN 16 08/25/2014   NA 138 08/25/2014   K 4.5 08/25/2014   CL 103 08/25/2014   CO2 24 08/25/2014    Lab Results  Component Value Date   HGBA1C 5.3 11/10/2016   Lipid Panel  No results found for: CHOL, TRIG, HDL, CHOLHDL, VLDL, LDLCALC      Depression screen PHQ 2/9 11/10/2016  Decreased Interest 2  Down, Depressed, Hopeless 2  PHQ - 2 Score 4  Altered sleeping 2  Tired, decreased energy 2  Change in appetite 3  Feeling bad or failure about yourself  2  Trouble concentrating 2  Moving slowly or fidgety/restless 2  Suicidal thoughts 0  PHQ-9 Score 17    Assessment and plan:   1. Anxiety and depression Crying, denies si/hi/avh. Trial lexapro 20qhs, instructed to take 1/2 tab qd x 4 days, than full dose Hannah [name] , sw to see pt today as well. Appreciate assistance.  2. Diabetes mellitus screening - POCT glycosylated hemoglobin (Hb A1C)  5.3  3. Situational stress See #1  4. Dizziness /headache May be somatization due to # 1, will follow. - CMP and Liver - CBC with Differential - Lipid Panel - TSH  5. Nonintractable headache, unspecified chronicity pattern, unspecified headache type Somatization? From anxiety/depression  6. Language barrier Interpreter was used to communicate directly with patient for the entire encounter including providing detailed patient instructions.   Return in about 4 weeks (around 12/08/2016) for anxiety /depression.  The patient was given clear instructions to go to ER or return to medical center if symptoms don't improve, worsen or new problems develop. The patient verbalized understanding. The patient was told to call to get lab results if they haven't heard anything in the next week.    This note has been created with Education officer, environmental. Any transcriptional errors are unintentional.   Pete Glatter, MD, MBA/MHA [facility] [place], [place] [phone]   11/10/2016, 9:26 AM

## 2016-11-11 ENCOUNTER — Telehealth: Payer: Self-pay | Admitting: Licensed Clinical Social Worker

## 2016-11-11 ENCOUNTER — Telehealth: Payer: Self-pay

## 2016-11-11 NOTE — Telephone Encounter (Signed)
CMA call to go over lab results  Patient Verify DOB  Patient was aware and understood   

## 2016-11-11 NOTE — Telephone Encounter (Signed)
-----   Message from Pete Glatterawn T Langeland, MD sent at 11/11/2016 10:23 AM EST ----- Please call. Kidney, liver, blood count all nml, suspect her dizziness due to stress/depression/anxiety.   Cholesterol decent control as well. thanks

## 2016-11-11 NOTE — Telephone Encounter (Signed)
LCSWA confirmed receipt of behavioral health referral. Burnice LoganShonna with [facility] provided referral to Felecia Janina Thompson, referral coordinator.

## 2016-12-24 ENCOUNTER — Ambulatory Visit: Payer: Medicaid Other | Attending: Internal Medicine | Admitting: Internal Medicine

## 2016-12-24 ENCOUNTER — Encounter: Payer: Self-pay | Admitting: Internal Medicine

## 2016-12-24 VITALS — BP 128/84 | HR 58 | Temp 98.2°F | Resp 16 | Wt 189.8 lb

## 2016-12-24 DIAGNOSIS — F419 Anxiety disorder, unspecified: Secondary | ICD-10-CM

## 2016-12-24 DIAGNOSIS — G4700 Insomnia, unspecified: Secondary | ICD-10-CM

## 2016-12-24 DIAGNOSIS — G44209 Tension-type headache, unspecified, not intractable: Secondary | ICD-10-CM

## 2016-12-24 DIAGNOSIS — F329 Major depressive disorder, single episode, unspecified: Secondary | ICD-10-CM

## 2016-12-24 DIAGNOSIS — F32A Depression, unspecified: Secondary | ICD-10-CM | POA: Insufficient documentation

## 2016-12-24 DIAGNOSIS — Z79899 Other long term (current) drug therapy: Secondary | ICD-10-CM | POA: Insufficient documentation

## 2016-12-24 DIAGNOSIS — Z79891 Long term (current) use of opiate analgesic: Secondary | ICD-10-CM | POA: Diagnosis not present

## 2016-12-24 MED ORDER — ESCITALOPRAM OXALATE 20 MG PO TABS: 20.0000 mg | ORAL_TABLET | Freq: Every day | ORAL | 3 refills | Status: DC

## 2016-12-24 MED ORDER — AMITRIPTYLINE HCL 25 MG PO TABS: 25.0000 mg | ORAL_TABLET | Freq: Every evening | ORAL | 2 refills | Status: DC | PRN

## 2016-12-24 NOTE — Patient Instructions (Addendum)
[facility] [phone]   Trastorno de ansiedad generalizada (Generalized Anxiety Disorder) El trastorno de ansiedad generalizada es un trastorno mental. Interfiere en las funciones vitales, incluyendo las [place], el trabajo y la escuela.  Es diferente de la ansiedad normal que todas las personas experimentan en algn momento de su vida en respuesta a sucesos y Chief Operating Officer. En verdad, la ansiedad normal nos ayuda a prepararnos y Human resources officer acontecimientos y actividades de la vida. La ansiedad normal desaparece despus de que el evento o la actividad ha finalizado.  El trastorno de ansiedad generalizada no est necesariamente relacionada con eventos o actividades especficas. Tambin causa un exceso de ansiedad en proporcin a sucesos o actividades especficas. En este trastorno la ansiedad es difcil de Chief Operating Officer. Los sntomas pueden variar de leves a muy graves. Las personas que sufren de trastorno de ansiedad generalizada pueden tener intensas olas de ansiedad con sntomas fsicos (ataques de pnico).  SNTOMAS  La ansiedad y la preocupacin asociada a este trastorno son difciles de Chief Operating Officer. Esta ansiedad y la preocupacin estn relacionados con muchos eventos de la vida y sus actividades y tambin ocurre durante ms [employer] que no ocurre, durante 6 meses o ms. Las personas que la sufren pueden tener tres o ms de los siguientes sntomas (uno o ms en los nios):   Glass blower/designer.  Dificultades de concentracin.   Irritabilidad.  Tensin muscular  Dificultad para dormirse o sueo poco satisfactorio. DIAGNSTICO  Se diagnostica a travs de una evaluacin realizada por el mdico. El mdico le har preguntas acerca de su estado de nimo, sntomas fsicos y sucesos de [place] vida. Le har preguntas sobre su historia clnica, el consumo de alcohol o drogas, incluyendo los medicamentos recetados. [employer] un examen fsico e indicar anlisis de [place]. Ciertas  enfermedades y el uso de determinadas sustancias pueden causar sntomas similares a este trastorno. Su mdico lo puede derivar a Music therapist en salud mental para una evaluacin ms profunda.Gerlean Ren  Las terapias siguientes se utilizan en el tratamiento de este trastorno:   Medicamentos - Se recetan antidepresivos para el control diario a Air cabin crew. Pueden indicarse tambin medicamentos para combatir la [employer] graves, especialmente cuando ocurren ataques de pnico.   Terapia conversada (psicoterapia) Ciertos tipos de psicoterapia pueden ser tiles en el tratamiento del trastorno de ansiedad generalizada, proporcionando apoyo, educacin y Optometrist. Una forma de psicoterapia llamada terapia cognitivo-conductual puede ensearle formas saludables de pensar y Publishing rights manager a los eventos y actividades de la vida diaria.  Tcnicasde manejo del estrs- Estas tcnicas incluyen el yoga, la meditacin y el ejercicio y pueden ser muy tiles cuando se practican con regularidad. Un especialista en salud mental puede ayudar a determinar qu tratamiento es mejor para usted. Algunas personas obtienen mejora con una terapia. Sin embargo, Economist requieren una combinacin de terapias.  Esta informacin no tiene Theme park manager el consejo del mdico. Asegrese de hacerle al mdico cualquier pregunta que tenga. Document Released: 12/27/2012 Document Revised: 09/22/2014 Elsevier Interactive Patient Education  2017 [employer].  -
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
## 2017-01-26 ENCOUNTER — Ambulatory Visit: Payer: Medicaid Other | Attending: Internal Medicine | Admitting: Internal Medicine

## 2017-01-26 ENCOUNTER — Other Ambulatory Visit (HOSPITAL_COMMUNITY)
Admission: RE | Admit: 2017-01-26 | Discharge: 2017-01-26 | Disposition: A | Discharge status: Home / Self Care | Payer: Medicaid Other | Source: Ambulatory Visit | Attending: Internal Medicine | Admitting: Internal Medicine

## 2017-01-26 ENCOUNTER — Encounter: Payer: Self-pay | Admitting: Internal Medicine

## 2017-01-26 VITALS — BP 128/80 | HR 72 | Temp 98.2°F | Resp 16 | Wt 187.8 lb

## 2017-01-26 DIAGNOSIS — F329 Major depressive disorder, single episode, unspecified: Secondary | ICD-10-CM | POA: Insufficient documentation

## 2017-01-26 DIAGNOSIS — H538 Other visual disturbances: Secondary | ICD-10-CM

## 2017-01-26 DIAGNOSIS — F32A Depression, unspecified: Secondary | ICD-10-CM

## 2017-01-26 DIAGNOSIS — G44209 Tension-type headache, unspecified, not intractable: Secondary | ICD-10-CM | POA: Diagnosis present

## 2017-01-26 DIAGNOSIS — Z124 Encounter for screening for malignant neoplasm of cervix: Secondary | ICD-10-CM | POA: Diagnosis not present

## 2017-01-26 DIAGNOSIS — G4700 Insomnia, unspecified: Secondary | ICD-10-CM | POA: Diagnosis not present

## 2017-01-26 DIAGNOSIS — Z01419 Encounter for gynecological examination (general) (routine) without abnormal findings: Secondary | ICD-10-CM | POA: Insufficient documentation

## 2017-01-26 MED ORDER — AMITRIPTYLINE HCL 25 MG PO TABS
25.0000 mg | ORAL_TABLET | Freq: Every day | ORAL | 1 refills | Status: DC
Start: 2017-01-26 — End: 2017-05-13

## 2017-01-26 MED ORDER — SUMATRIPTAN SUCCINATE 25 MG PO TABS: 25.0000 mg | ORAL_TABLET | ORAL | 0 refills | Status: AC | PRN

## 2017-01-26 MED ORDER — ESCITALOPRAM OXALATE 20 MG PO TABS: 20.0000 mg | ORAL_TABLET | Freq: Every day | ORAL | 3 refills | Status: DC

## 2017-01-26 NOTE — Patient Instructions (Addendum)
Cefalea migraosa (Migraine Headache) Una cefalea migraosa es un dolor muy intenso y punzante en uno o ambos lados de la cabeza. Hable con su mdico [employer] factores que pueden causar Animal nutritionist) las Soil scientist. CUIDADOS EN EL [employer] solo los [employer] segn le haya indicado el mdico.  Cuando tenga la migraa, acustese en un cuarto oscuro y tranquilo  Lleve un registro diario para averiguar si hay ciertas cosas que le provocan la cefalea migraosa. Por ejemplo, escriba:  Lo que usted come y bebe.  Cunto tiempo duerme.  Algn cambio en su dieta o en los medicamentos.  Beba menos alcohol.  Si fuma, deje de hacerlo.  Duerma lo suficiente.  Disminuya todo tipo de estrs de la vida diaria.  Mantenga las luces tenues si le [employer] luces brillantes o hacen que la [place]. SOLICITE AYUDA DE INMEDIATO SI:  La migraa empeora.  Tiene fiebre.  Presenta rigidez en el cuello.  Tiene dificultad para ver.  Sus msculos estn dbiles, o pierde el control muscular.  Pierde el equilibrio o tiene problemas para Advertising account planner.  Siente que se desvanece (debilidad) o se desmaya.  Tiene malos sntomas que son diferentes a los primeros sntomas. ASEGRESE DE QUE:  Comprende estas instrucciones.  Controlar su afeccin.  Recibir ayuda de inmediato si no mejora o si empeora. Esta informacin no tiene Theme park manager el consejo del mdico. Asegrese de hacerle al mdico cualquier pregunta que tenga. Document Released: 11/28/2008 Document Revised: 09/06/2013 Document Reviewed: 02/18/2016 Elsevier Interactive Patient Education  2017 Elsevier Inc.  -  Insomnio (Insomnia) El insomnio es un trastorno del sueo que causa dificultades para conciliar el sueo o para [place]. Puede producir cansancio (fatiga), falta de energa, dificultad para concentrarse, cambios en el estado de nimo y mal rendimiento escolar o laboral. Hay tres formas diferentes de  clasificar el insomnio:  Dificultad para conciliar el sueo.  Dificultad para mantener el sueo.  Despertar muy precoz por la maana. Cualquier tipo de insomnio puede ser a Air cabin crew (crnico) o a Product manager (agudo). Ambos son frecuentes. Generalmente, el insomnio a corto plazo dura tres meses o menos tiempo. El crnico ocurre al menos tres veces por semana durante ms de tres meses. CAUSAS El insomnio puede deberse a otra afeccin, situacin o sustancia, por ejemplo:  Ansiedad.  Algunos medicamentos.  Enfermedad por reflujo gastroesofgico (ERGE) u otras enfermedades gastrointestinales.  Asma y otras enfermedades respiratorias.  Sndrome de las piernas inquietas, apnea del sueo u otros trastornos del sueo.  Dolor crnico.  Menopausia, que puede incluir calores repentinos.  Ictus.  Consumo excesivo de alcohol, tabaco u drogas ilegales.  Depresin.  Cafena.  Trastornos neurolgicos, como enfermedad de Alzheimer.  Hiperactividad tiroidea (hipertiroidismo). Es posible que la causa del insomnio no se conozca. FACTORES DE RIESGO Los factores de riesgo de tener insomnio incluyen lo siguiente:  El sexo. La mujeres se ven ms afectadas que los hombres.  La edad. El insomnio es ms frecuente a medida que una persona envejece.  El estrs. Esto puede incluir su vida profesional o personal.  Los ingresos. El insomnio es ms frecuente en las personas cuyos ingresos son ms bajos.  La falta de actividad fsica.  Los horarios de trabajo irregulares o los turnos nocturnos.  Los viajes a lugares de diferentes zonas horarias. SIGNOS Y SNTOMAS Si tiene insomnio, el sntoma principal es la dificultad para conciliar el sueo o mantenerlo. Esto puede derivar en otros sntomas, por ejemplo:  Sentirse fatigado.  Ponerse nervioso por  tener que irse a dormir.  No sentirse descansado por la maana.  Tener dificultad para concentrarse.  Sentirse irritable, ansioso o  deprimido. TRATAMIENTO El tratamiento para el insomnio depende de la causa. Si se debe a una enfermedad preexistente, el tratamiento se centrar en el abordaje de la enfermedad. El tratamiento tambin puede incluir lo siguiente:  Medicamentos que lo ayuden a dormir.  Asesoramiento psicolgico o terapia.  Cambios en el estilo de vida. INSTRUCCIONES PARA EL CUIDADO EN EL HOGAR  Tome los medicamentos solamente como se lo haya indicado el mdico.  Establezca horarios habituales para dormir y Chiropodistdespertarse. No tome siestas.  Lleve un registro del sueo ya que podra ser de utilidad para que usted y a su mdico puedan determinar qu podra estar causndole insomnio. Incluya lo siguiente:  Cundo duerme.  Cundo se despierta durante la noche.  Qu tan bien duerme.  Qu tan relajado se siente al da siguiente.  Cualquier efecto secundario de los medicamentos que toma.  Lo que usted come y bebe.  Convierta a su habitacin en un lugar cmodo donde sea fcil conciliar el sueo:  Coloque persianas o cortinas especiales oscuras que impidan la entrada de la luz del exterior.  Para bloquear los ruidos, use un aparato que reproduce sonidos ambientales o relajantes de fondo.  Mantenga baja la temperatura.  Haga ejercicio regularmente como se lo haya indicado el mdico. No haga ejercicio justo antes de la hora de [place] Heightsacostarse.  Utilice tcnicas de relajacin para controlar el estrs. Pdale al mdico que le sugiera algunas tcnicas que sean adecuadas para usted. Estos pueden incluir lo siguiente:  Ejercicios de respiracin.  Rutinas para aliviar la tensin muscular.  Visualizacin de escenas apacibles.  Disminucin del consumo de alcohol, bebidas con cafena y cigarrillos, especialmente cerca de la hora de [place]ostarse, ya que pueden perturbarle el sueo.  No coma en exceso ni consuma comidas picantes justo antes de la hora de [place]ostarse. Esto puede causarle molestias digestivas y dificultades  para dormir.  Limite el uso de pantallas antes de la hora de [place]ostarse. Esto incluye lo siguiente:  Mirar televisin.  Usar el telfono inteligente, la tableta y la computadora.  Siga una rutina. Esto puede ayudarlo a conciliar el sueo ms rpidamente. Intente hacer una actividad tranquila, cepillarse los dientes e irse a la cama a la misma hora todas las noches.  Levntese de la cama si sigue despierto despus de 15minutos de haber intentado dormirse. Mantenga bajas las luces, pero intente leer o hacer una actividad tranquila. Cuando tenga sueo, regrese a Pharmacist, hospitalla cama.  Conduzca con cuidado. No conduzca si est muy somnoliento.  Concurra a todas las visitas de control, segn le indique su mdico. Esto es importante. SOLICITE ATENCIN MDICA SI:  Est cansado durante el da o tiene dificultades en su rutina diaria debido a la somnolencia.  Sigue teniendo problemas para dormir o Teacher, English as a foreign languageestos empeoran. SOLICITE ATENCIN MDICA DE INMEDIATO SI:  Tiene pensamientos serios acerca de lastimarse a usted mismo o daar a Engineer, maintenance (IT)otra persona. Esta informacin no tiene Theme park managercomo fin reemplazar el consejo del mdico. Asegrese de hacerle al mdico cualquier pregunta que tenga. Document Released: 09/01/2005 Document Revised: 09/22/2014 Document Reviewed: 06/02/2014 Elsevier Interactive Patient Education  2017 [employer]

## 2017-01-26 NOTE — Addendum Note (Signed)
Addended by: Dierdre SearlesLANGELAND, Raha Tennison T on: 01/26/2017 04:41 PM   Modules accepted: Orders

## 2017-01-26 NOTE — Progress Notes (Signed)
Hannah [name], is a 31 y.o. female  ZOX:[id number]  [id number]:[id number]  DOB - 02/06/1986  Chief Complaint  Patient presents with  . Gynecologic Exam  . Headache        Subjective:   Hannah [name] is a 31 y.o. female here today for a follow up visit, last seen 11/10/16 for depression and ha. Of note, she was not able to pick up elavil b/c of insurance problems. States her ha are occurring more often, associated w/ photophobia and sometimes n/v.  She tries to take motrin which only helps a little bit.  Doing well w/ lexapro, denies si/hi/avh.  Here for pap smear.  Denies concern for std.  Notes regular menses, last menses May 1. Noes gets bilat breast tenderness w/ onset of menses, but no palpable masses. No family hx of breast ca.   Patient has No headache, No chest pain, No abdominal pain - No Nausea, No new weakness tingling or numbness, No Cough - SOB.  Interpreter was used to communicate directly with patient for the entire encounter including providing detailed patient instructions.   No problems updated.  ALLERGIES: No Known Allergies  PAST MEDICAL HISTORY: Past Medical History:  Diagnosis Date  . Infection    UTI in early preg  . Tuberculosis     MEDICATIONS AT HOME: Prior to Admission medications   Medication Sig Start Date End Date Taking? Authorizing Provider  amitriptyline (ELAVIL) 25 MG tablet Take 1 tablet (25 mg total) by mouth at bedtime. 01/26/17   Royden Bulman, Kathaleen Grinderawn T, MD  escitalopram (LEXAPRO) 20 MG tablet Take 1 tablet (20 mg total) by mouth daily. 1 tab daily. 01/26/17   Pete GlatterLangeland, Bensyn Bornemann T, MD  ibuprofen (ADVIL,MOTRIN) 600 MG tablet Take 1 tablet (600 mg total) by mouth every 6 (six) hours as needed for mild pain, moderate pain or cramping. Patient not taking: Reported on 11/10/2016 08/20/14   Cheral MarkerBooker, Kimberly R, CNM  oxyCODONE-acetaminophen (PERCOCET/ROXICET) 5-325 MG per tablet Take 1-2 tablets by mouth every 4 (four) hours as needed for  moderate pain or severe pain. Patient not taking: Reported on 08/25/2014 08/20/14   Cheral MarkerBooker, Kimberly R, CNM  Prenatal Vit-Fe Fumarate-FA (PRENATAL MULTIVITAMIN) TABS tablet Take 1 tablet by mouth daily at 12 noon.    [provider]  SUMAtriptan (IMITREX) 25 MG tablet Take 1 tablet (25 mg total) by mouth every 2 (two) hours as needed for migraine. May repeat in 2 hours if headache persists or recurs. 01/26/17   Pete GlatterLangeland, Naylani Bradner T, MD     Objective:   Vitals:   01/26/17 1349  BP: 128/80  Pulse: 72  Resp: 16  Temp: 98.2 F (36.8 C)  TempSrc: Oral  SpO2: 97%  Weight: 187 lb 12.8 oz (85.2 kg)    Exam General appearance : Awake, alert, not in any distress. Speech Clear. Not toxic looking, pleasant. HEENT: Atraumatic and Normocephalic, pupils equally reactive to light. Neck: supple, no JVD. No cervical lymphadenopathy.  Chest:Good air entry bilaterally, no added sounds. Breast /axilla: bilat nml appearance, not dippling noted. No palpable masses/nodules/nipple discharge noted on exam  CVS: S1 S2 regular, no murmurs/gallups or rubs. Abdomen: Bowel sounds active, Non tender and not distended with no gaurding, rigidity or rebound. Pelvic Exam: Cervix normal in appearance w/ recent menses sequela appearance, external genitalia normal, no adnexal masses or tenderness, no cervical motion tenderness, rectovaginal septum normal, uterus normal size, shape, and consistency and vagina normal without discharge   Extremities: B/L Lower Ext shows no  edema, both legs are warm to touch Neurology: Awake alert, and oriented X 3, CN II-XII grossly intact, Non focal Skin:No Rash  Data Review Lab Results  Component Value Date   HGBA1C 5.3 11/10/2016    Depression screen [facility] 2/9 01/26/2017 12/24/2016 11/10/2016  Decreased Interest 1 2 2   Down, Depressed, Hopeless 1 2 2   PHQ - 2 Score 2 4 4   Altered sleeping 1 1 2   Tired, decreased energy 1 1 2   Change in appetite 1 1 3   Feeling bad or failure  about yourself  1 2 2   Trouble concentrating 1 0 2  Moving slowly or fidgety/restless 1 0 2  Suicidal thoughts 1 0 0  PHQ-9 Score 9 9 17       Assessment & Plan   1. Pap smear for cervical cancer screening - Cytology - PAP  2. Insomnia, unspecified type Tried rx elavil 25 qhs again, not certain why she was unable to fill  3. Depression, unspecified depression type Continue lexapro 20 qd, it is helping, denies si/hi/avh  4. Tension-type headache, not intractable, unspecified chronicity pattern, more migraine type now, w/ photophobia and n/v. - trial imitrex 25mg  prn rx. - elavil 25 mg qhs  5. Blurry vision, bilateral - Ambulatory referral to Ophthalmology     Patient have been counseled extensively about nutrition and exercise  Return in about 3 months (around 04/28/2017), or if symptoms worsen or fail to improve.  The patient was given clear instructions to go to ER or return to medical center if symptoms don't improve, worsen or new problems develop. The patient verbalized understanding. The patient was told to call to get lab results if they haven't heard anything in the next week.   This note has been created with Education officer, environmental. Any transcriptional errors are unintentional.   Pete Glatter, MD, MBA/MHA [facility] and [facility] [place], [place] [phone]   01/26/2017, 2:20 PM

## 2017-01-27 LAB — CERVICOVAGINAL ANCILLARY ONLY: Wet Prep (BD Affirm): NEGATIVE

## 2017-01-28 LAB — CYTOLOGY - PAP
Diagnosis: NEGATIVE
HPV (WINDOPATH): NOT DETECTED

## 2017-01-29 ENCOUNTER — Encounter: Payer: Self-pay | Admitting: Internal Medicine

## 2017-01-30 ENCOUNTER — Encounter: Payer: Self-pay | Admitting: Internal Medicine

## 2017-02-02 ENCOUNTER — Encounter: Payer: Self-pay | Admitting: Internal Medicine

## 2017-02-04 ENCOUNTER — Telehealth: Payer: Self-pay

## 2017-02-04 NOTE — Telephone Encounter (Signed)
[employer] [place] [id number] contacted pt to go over lab results pt didn't answer lvm asking pt to give me a call at her earliest convenience    If pt calls back please give results: Pap smear was negative, no yeast/bv infections noted. Repeat in 3 years.

## 2017-04-24 ENCOUNTER — Encounter (HOSPITAL_COMMUNITY): Payer: Self-pay | Admitting: Emergency Medicine

## 2017-04-24 DIAGNOSIS — R1110 Vomiting, unspecified: Secondary | ICD-10-CM | POA: Diagnosis present

## 2017-04-24 DIAGNOSIS — Z5321 Procedure and treatment not carried out due to patient leaving prior to being seen by health care provider: Secondary | ICD-10-CM | POA: Diagnosis not present

## 2017-04-24 LAB — COMPREHENSIVE METABOLIC PANEL
ALK PHOS: 62 U/L (ref 38–126)
ALT: 26 U/L (ref 14–54)
ANION GAP: 9 (ref 5–15)
AST: 24 U/L (ref 15–41)
Albumin: 4.5 g/dL (ref 3.5–5.0)
BUN: 17 mg/dL (ref 6–20)
CALCIUM: 9.3 mg/dL (ref 8.9–10.3)
CO2: 23 mmol/L (ref 22–32)
Chloride: 107 mmol/L (ref 101–111)
Creatinine, Ser: 0.62 mg/dL (ref 0.44–1.00)
GFR calc non Af Amer: >60 mL/min (ref >60)
Glucose, Bld: 111 mg/dL — ABNORMAL HIGH (ref 65–99)
Potassium: 3.7 mmol/L (ref 3.5–5.1)
SODIUM: 139 mmol/L (ref 135–145)
TOTAL PROTEIN: 7.6 g/dL (ref 6.5–8.1)
Total Bilirubin: 0.7 mg/dL (ref 0.3–1.2)

## 2017-04-24 LAB — CBC
HCT: 37.3 % (ref 36.0–46.0)
HEMOGLOBIN: 12.6 g/dL (ref 12.0–15.0)
MCH: 28.3 pg (ref 26.0–34.0)
MCHC: 33.8 g/dL (ref 30.0–36.0)
MCV: 83.6 fL (ref 78.0–100.0)
Platelets: 306 10*3/uL (ref 150–400)
RBC: 4.46 MIL/uL (ref 3.87–5.11)
RDW: 13.6 % (ref 11.5–15.5)
WBC: 11.8 10*3/uL — ABNORMAL HIGH (ref 4.0–10.5)

## 2017-04-24 LAB — URINALYSIS, ROUTINE W REFLEX MICROSCOPIC
BILIRUBIN URINE: NEGATIVE
Bacteria, UA: NONE SEEN
GLUCOSE, UA: NEGATIVE mg/dL
HGB URINE DIPSTICK: NEGATIVE
KETONES UR: NEGATIVE mg/dL
Leukocytes, UA: NEGATIVE
NITRITE: NEGATIVE
PROTEIN: 100 mg/dL — AB
Specific Gravity, Urine: 1.023 (ref 1.005–1.030)
pH: 6 (ref 5.0–8.0)

## 2017-04-24 LAB — I-STAT BETA HCG BLOOD, ED (MC, WL, AP ONLY): I-stat hCG, quantitative: <5 m[IU]/mL (ref <5)

## 2017-04-24 LAB — LIPASE, BLOOD: LIPASE: 23 U/L (ref 11–51)

## 2017-04-24 NOTE — ED Triage Notes (Signed)
Patient reports nausea, headache  and multiple emesis onset this afternoon , denies diarrhea , no fever or chills .

## 2017-04-25 ENCOUNTER — Emergency Department (HOSPITAL_COMMUNITY)
Admission: EM | Admit: 2017-04-25 | Discharge: 2017-04-25 | Disposition: A | Discharge status: Home / Self Care | Payer: Medicaid Other | Attending: Emergency Medicine | Admitting: Emergency Medicine

## 2017-05-06 ENCOUNTER — Ambulatory Visit: Payer: Medicaid Other

## 2017-05-06 NOTE — Progress Notes (Deleted)
Patient ID: Hannah [name], female   DOB: 01/21/1986, 31 y.o.   MRN: [id number] After being seen in the ED 04/25/2017

## 2017-05-13 ENCOUNTER — Encounter: Payer: Self-pay | Admitting: Physician Assistant

## 2017-05-13 ENCOUNTER — Ambulatory Visit: Payer: Medicaid Other | Attending: Internal Medicine | Admitting: Physician Assistant

## 2017-05-13 VITALS — BP 120/72 | HR 70 | Temp 98.0°F | Wt 188.4 lb

## 2017-05-13 DIAGNOSIS — M546 Pain in thoracic spine: Secondary | ICD-10-CM

## 2017-05-13 DIAGNOSIS — Z79899 Other long term (current) drug therapy: Secondary | ICD-10-CM | POA: Diagnosis not present

## 2017-05-13 DIAGNOSIS — R42 Dizziness and giddiness: Secondary | ICD-10-CM | POA: Diagnosis not present

## 2017-05-13 DIAGNOSIS — R51 Headache: Secondary | ICD-10-CM | POA: Insufficient documentation

## 2017-05-13 DIAGNOSIS — R0981 Nasal congestion: Secondary | ICD-10-CM | POA: Diagnosis not present

## 2017-05-13 DIAGNOSIS — R519 Headache, unspecified: Secondary | ICD-10-CM

## 2017-05-13 MED ORDER — METHOCARBAMOL 500 MG PO TABS: 500.0000 mg | ORAL_TABLET | Freq: Three times a day (TID) | ORAL | 0 refills | Status: DC

## 2017-05-13 MED ORDER — FLUTICASONE PROPIONATE 50 MCG/ACT NA SUSP: 2.0000 | Freq: Every day | NASAL | 2 refills | Status: AC

## 2017-05-13 MED ORDER — IBUPROFEN 600 MG PO TABS: 600.0000 mg | ORAL_TABLET | Freq: Three times a day (TID) | ORAL | 0 refills | Status: DC | PRN

## 2017-05-13 MED ORDER — MECLIZINE HCL 25 MG PO TABS: 25.0000 mg | ORAL_TABLET | Freq: Three times a day (TID) | ORAL | 0 refills | Status: AC | PRN

## 2017-05-13 NOTE — Progress Notes (Signed)
Patient ID: Hannah [name], female   DOB: 01-Dec-1985, 31 y.o.   MRN: [id number]   Hannah [name], is a 31 y.o. female  WUJ:[id number]  [id number]  DOB - 1986-04-23  Subjective:  Chief Complaint and HPI: Hannah [name] is a 31 y.o. female here today C/o HA and dizziness X 3 weeks.  HA are frontal.  Went to ED 04/25/2017 and evaluation was initiated but she left w/o being seen.  Today, she c/o ~3 week h/o HA and some dizziness.  Headaches have been daily.  She has not taken any OTC meds.  She is out of Imitrex and has a h/o migraines.  HA is frontal.  Moderate severity.  No associated s/sx other than dizziness and sometimes nausea.  Describes pain as dull and constant.  No vomiting or diarrhea. No aura.  No photo/phonophobia.  + does feel some stress and is having some congestion. No CP/SOB.  Dizziness comes and goes-worse if changing positions.    Also c/o upper/thoracic back pain for about 2 weeks.  This pain is limiting her ROM.  It is almost constant.  She denies injury.  She hasn't taken any OTC meds for it. No UE weakness or limitations.  No paresthesias.    [employer] interpreters "Larey Brick" translating  ED/Hospital notes reviewed.    Social History: Family history:  ROS:   Constitutional:  No f/c, No night sweats, No unexplained weight loss. EENT:  No vision changes, No blurry vision, No hearing changes. No mouth, throat, or ear problems other than congestion Respiratory: No cough, No SOB Cardiac: No CP, no palpitations GI:  No abd pain, No N/V/D. GU: No Urinary s/sx Musculoskeletal: +upper back pain Neuro: + headache, + dizziness, no motor weakness.  Skin: No rash Endocrine:  No polydipsia. No polyuria.  Psych: Denies SI/HI  No problems updated.  ALLERGIES: No Known Allergies  PAST MEDICAL HISTORY: Past Medical History:  Diagnosis Date  . Infection    UTI in early preg  . Tuberculosis     MEDICATIONS AT HOME: Prior to Admission medications     Medication Sig Start Date End Date Taking? Authorizing Provider  fluticasone (FLONASE) 50 MCG/ACT nasal spray Place 2 sprays into both nostrils daily. 05/13/17   Anders Simmonds, PA-C  ibuprofen (ADVIL,MOTRIN) 600 MG tablet Take 1 tablet (600 mg total) by mouth every 6 (six) hours as needed for mild pain, moderate pain or cramping. Patient not taking: Reported on 11/10/2016 08/20/14   Cheral Marker, CNM  ibuprofen (ADVIL,MOTRIN) 600 MG tablet Take 1 tablet (600 mg total) by mouth every 8 (eight) hours as needed. 05/13/17   Anders Simmonds, PA-C  meclizine (ANTIVERT) 25 MG tablet Take 1 tablet (25 mg total) by mouth 3 (three) times daily as needed for dizziness. 05/13/17   Anders Simmonds, PA-C  methocarbamol (ROBAXIN) 500 MG tablet Take 1 tablet (500 mg total) by mouth 3 (three) times daily. 05/13/17   Anders Simmonds, PA-C  Prenatal Vit-Fe Fumarate-FA (PRENATAL MULTIVITAMIN) TABS tablet Take 1 tablet by mouth daily at 12 noon.    [provider]  SUMAtriptan (IMITREX) 25 MG tablet Take 1 tablet (25 mg total) by mouth every 2 (two) hours as needed for migraine. May repeat in 2 hours if headache persists or recurs. Patient not taking: Reported on 05/13/2017 01/26/17   Pete Glatter, MD     Objective:  EXAM:   Vitals:   05/13/17 1146  BP: 120/72  Pulse: 70  Temp:  98 F (36.7 C)  TempSrc: Oral  SpO2: 98%  Weight: 188 lb 6.4 oz (85.5 kg)    General appearance : A&OX3. NAD. Non-toxic-appearing HEENT: Atraumatic and Normocephalic.  PERRLA. EOM intact.  TM clear full B; no infection. Mouth-MMM, post pharynx WNL w/o erythema, No PND. Turbinates enlarged and boggy Neck: supple, no JVD. No cervical lymphadenopathy. No thyromegaly Chest/Lungs:  Breathing-non-labored, Good air entry bilaterally, breath sounds normal without rales, rhonchi, or wheezing  CVS: S1 S2 regular, no murmurs, gallops, rubs  Back:no bony TTP over the thoracic spine.  +spasm of the trapezius B.  BUE  DTR=intact.   Extremities: Bilateral Lower Ext shows no edema, both legs are warm to touch with = pulse throughout Neurology:  CN II-XII grossly intact, Non focal.   Psych:  TP linear. J/I WNL. Normal speech. Appropriate eye contact and affect.  Skin:  No Rash  Data Review Lab Results  Component Value Date   HGBA1C 5.3 11/10/2016     Assessment & Plan   1. Acute bilateral thoracic back pain - ibuprofen (ADVIL,MOTRIN) 600 MG tablet; Take 1 tablet (600 mg total) by mouth every 8 (eight) hours as needed.  Dispense: 60 tablet; Refill: 0 - methocarbamol (ROBAXIN) 500 MG tablet; Take 1 tablet (500 mg total) by mouth 3 (three) times daily.  Dispense: 90 tablet; Refill: 0  2. Nonintractable headache, unspecified chronicity pattern, unspecified headache type Seems tension/sinus type HA - ibuprofen (ADVIL,MOTRIN) 600 MG tablet; Take 1 tablet (600 mg total) by mouth every 8 (eight) hours as needed.  Dispense: 60 tablet; Refill: 0  and methocarbamol  3. Dizziness Sinus congestion.  No neurological red flags today.   - fluticasone (FLONASE) 50 MCG/ACT nasal spray; Place 2 sprays into both nostrils daily.  Dispense: 16 g; Refill: 2 - meclizine (ANTIVERT) 25 MG tablet; Take 1 tablet (25 mg total) by mouth 3 (three) times daily as needed for dizziness.  Dispense: 30 tablet; Refill: 0     Patient have been counseled extensively about nutrition and exercise  Return if symptoms worsen or fail to improve.  The patient was given clear instructions to go to ER or return to medical center if symptoms don't improve, worsen or new problems develop. The patient verbalized understanding. The patient was told to [name] to get lab results if they haven't heard anything in the next week.     Georgian CoAngela Ellise Kovack, PA-C [facility] [place], [place] [phone]   05/13/2017, 12:55 PM

## 2017-08-26 ENCOUNTER — Ambulatory Visit: Payer: Self-pay

## 2018-06-09 ENCOUNTER — Ambulatory Visit: Payer: Self-pay | Attending: Nurse Practitioner | Admitting: Nurse Practitioner

## 2018-06-09 ENCOUNTER — Encounter: Payer: Self-pay | Admitting: Nurse Practitioner

## 2018-06-09 VITALS — BP 131/83 | HR 66 | Temp 98.4°F | Ht 60.0 in | Wt 187.4 lb

## 2018-06-09 DIAGNOSIS — R0602 Shortness of breath: Secondary | ICD-10-CM | POA: Insufficient documentation

## 2018-06-09 DIAGNOSIS — Z0000 Encounter for general adult medical examination without abnormal findings: Secondary | ICD-10-CM | POA: Insufficient documentation

## 2018-06-09 NOTE — Progress Notes (Signed)
Assessment & Plan:  Hannah [name] was seen today for establish care.  Diagnoses and all orders for this visit:  Routine health maintenance -     CBC -     CMP14+EGFR -     Cancel: Lipid panel -     Hemoglobin A1c -     Lipid panel    Patient has been counseled on age-appropriate routine health concerns for screening and prevention. These are reviewed and up-to-date. Referrals have been placed accordingly. Immunizations are up-to-date or declined.    Subjective:   Chief Complaint  Patient presents with  . Establish Care    Pt. is here to establish care.    HPI Hannah [name] 32 y.o. female presents to office today to establish care. She has a remote history of depression but does not take any antidepressants. She has complaints today of intermittent shortness of breath.  Dyspnea: Patient complains of shortness of breath at rest.  Symptoms include shortness of breath. Symptoms began a few weeks ago, completely resolved since that time.  Denies chest pain, palpitations, lightheadedness, dizziness, headaches or BLE edema.  Associated symptoms include none. Patient has not had recent travel.  Weight has been stable.  Appetite has been unchanged. Symptoms are exacerbated by nothing. Symptoms are alleviated by nothing and improves on its own.    Review of Systems  Constitutional: Negative for fever, malaise/fatigue and weight loss.  HENT: Negative.  Negative for nosebleeds.   Eyes: Negative.  Negative for blurred vision, double vision and photophobia.  Respiratory: Positive for shortness of breath (2 previous intermittent episodes). Negative for cough.   Cardiovascular: Negative.  Negative for chest pain, palpitations and leg swelling.  Gastrointestinal: Negative.  Negative for heartburn, nausea and vomiting.  Musculoskeletal: Negative.  Negative for myalgias.  Neurological: Negative.  Negative for dizziness, focal weakness, seizures and headaches.  Psychiatric/Behavioral: Positive  for depression (history of ). Negative for suicidal ideas.    Past Medical History:  Diagnosis Date  . Infection    UTI in early preg  . Tuberculosis     Past Surgical History:  Procedure Laterality Date  . NO PAST SURGERIES    . TUBAL LIGATION Bilateral 08/19/2014   Procedure: POST PARTUM TUBAL LIGATION;  Surgeon: Truett Mainland, DO;  Location: [facility];  Service: Gynecology;  Laterality: Bilateral;    Family History  Problem Relation Age of Onset  . Hearing loss Neg Hx     Social History Reviewed with no changes to be made today.   Outpatient Medications Prior to Visit  Medication Sig Dispense Refill  . ibuprofen (ADVIL,MOTRIN) 600 MG tablet Take 1 tablet (600 mg total) by mouth every 6 (six) hours as needed for mild pain, moderate pain or cramping. 30 tablet 0  . fluticasone (FLONASE) 50 MCG/ACT nasal spray Place 2 sprays into both nostrils daily. (Patient not taking: Reported on 06/09/2018) 16 g 2  . ibuprofen (ADVIL,MOTRIN) 600 MG tablet Take 1 tablet (600 mg total) by mouth every 8 (eight) hours as needed. (Patient not taking: Reported on 06/09/2018) 60 tablet 0  . meclizine (ANTIVERT) 25 MG tablet Take 1 tablet (25 mg total) by mouth 3 (three) times daily as needed for dizziness. (Patient not taking: Reported on 06/09/2018) 30 tablet 0  . methocarbamol (ROBAXIN) 500 MG tablet Take 1 tablet (500 mg total) by mouth 3 (three) times daily. (Patient not taking: Reported on 06/09/2018) 90 tablet 0  . Prenatal Vit-Fe Fumarate-FA (PRENATAL MULTIVITAMIN) TABS tablet Take 1 tablet  by mouth daily at 12 noon.    . SUMAtriptan (IMITREX) 25 MG tablet Take 1 tablet (25 mg total) by mouth every 2 (two) hours as needed for migraine. May repeat in 2 hours if headache persists or recurs. (Patient not taking: Reported on 05/13/2017) 10 tablet 0   No facility-administered medications prior to visit.     No Known Allergies     Objective:    BP 131/83 (BP Location: Left Arm, Patient Position:  Sitting, Cuff Size: Normal)   Pulse 66   Temp 98.4 F (36.9 C) (Oral)   Ht 5' (1.524 m)   Wt 187 lb 6.4 oz (85 kg)   LMP 06/01/2018   SpO2 99%   BMI 36.60 kg/m  Wt Readings from Last 3 Encounters:  06/09/18 187 lb 6.4 oz (85 kg)  05/13/17 188 lb 6.4 oz (85.5 kg)  01/26/17 187 lb 12.8 oz (85.2 kg)    Physical Exam  Constitutional: She is oriented to person, place, and time. She appears well-developed and well-nourished. She is cooperative.  HENT:  Head: Normocephalic and atraumatic.  Eyes: EOM are normal.  Neck: Normal range of motion.  Cardiovascular: Normal rate, regular rhythm and normal heart sounds. Exam reveals no gallop and no friction rub.  No murmur heard. Pulmonary/Chest: Effort normal and breath sounds normal. No tachypnea. No respiratory distress. She has no decreased breath sounds. She has no wheezes. She has no rhonchi. She has no rales. She exhibits no tenderness.  Abdominal: Bowel sounds are normal.  Musculoskeletal: Normal range of motion. She exhibits no edema.  Neurological: She is alert and oriented to person, place, and time. Coordination normal.  Skin: Skin is warm and dry.  Psychiatric: She has a normal mood and affect. Her behavior is normal. Judgment and thought content normal.  Nursing note and vitals reviewed.      Patient has been counseled extensively about nutrition and exercise as well as the importance of adherence with medications and regular follow-up. The patient was given clear instructions to go to ER or return to medical center if symptoms don't improve, worsen or new problems develop. The patient verbalized understanding.   Follow-up: Return for Physical and EKG .   Gildardo Pounds, FNP-BC [facility] and [place], [place]   06/09/2018, 4:05 PM

## 2018-06-10 LAB — CBC
Hematocrit: 36.5 % (ref 34.0–46.6)
Hemoglobin: 11.9 g/dL (ref 11.1–15.9)
MCH: 27.4 pg (ref 26.6–33.0)
MCHC: 32.6 g/dL (ref 31.5–35.7)
MCV: 84 fL (ref 79–97)
PLATELETS: 330 10*3/uL (ref 150–450)
RBC: 4.35 x10E6/uL (ref 3.77–5.28)
RDW: 12.6 % (ref 12.3–15.4)
WBC: 8 10*3/uL (ref 3.4–10.8)

## 2018-06-10 LAB — LIPID PANEL
CHOL/HDL RATIO: 4.7 ratio — AB (ref 0.0–4.4)
CHOLESTEROL TOTAL: 163 mg/dL (ref 100–199)
HDL: 35 mg/dL — ABNORMAL LOW (ref >39)
LDL CALC: 94 mg/dL (ref 0–99)
TRIGLYCERIDES: 169 mg/dL — AB (ref 0–149)
VLDL CHOLESTEROL CAL: 34 mg/dL (ref 5–40)

## 2018-06-10 LAB — CMP14+EGFR
ALBUMIN: 4.9 g/dL (ref 3.5–5.5)
ALK PHOS: 73 IU/L (ref 39–117)
ALT: 34 IU/L — AB (ref 0–32)
AST: 22 IU/L (ref 0–40)
Albumin/Globulin Ratio: 2.1 (ref 1.2–2.2)
BUN / CREAT RATIO: 27 — AB (ref 9–23)
BUN: 16 mg/dL (ref 6–20)
CO2: 24 mmol/L (ref 20–29)
Calcium: 9.4 mg/dL (ref 8.7–10.2)
Chloride: 101 mmol/L (ref 96–106)
Creatinine, Ser: 0.59 mg/dL (ref 0.57–1.00)
GFR calc Af Amer: 139 mL/min/{1.73_m2} (ref >59)
GFR calc non Af Amer: 121 mL/min/{1.73_m2} (ref >59)
GLUCOSE: 92 mg/dL (ref 65–99)
Globulin, Total: 2.3 g/dL (ref 1.5–4.5)
POTASSIUM: 4 mmol/L (ref 3.5–5.2)
Sodium: 141 mmol/L (ref 134–144)
Total Protein: 7.2 g/dL (ref 6.0–8.5)

## 2018-06-10 LAB — HEMOGLOBIN A1C
Est. average glucose Bld gHb Est-mCnc: 108 mg/dL
Hgb A1c MFr Bld: 5.4 % (ref 4.8–5.6)

## 2018-06-15 ENCOUNTER — Telehealth: Payer: Self-pay

## 2018-06-15 NOTE — Telephone Encounter (Signed)
-----   Message from Claiborne Rigg, NP sent at 06/13/2018 11:18 AM EDT ----- Labs are essentially normal. There are some minor variations in your blood work that do not require any additional work up at this time. Will continue to monitor. Make sure you are drinking at least 48 oz of water per day. Work on eating a low fat, heart healthy diet and participate in regular aerobic exercise program to control as well. Exercise at least  30 minutes per day-5 days per week. Avoid red meat. No fried foods. No junk foods, sodas, sugary foods or drinks, unhealthy snacking, alcohol or smoking.

## 2018-06-15 NOTE — Telephone Encounter (Signed)
CMA attempt to reach patient to inform on results.  No answer and left a VM for patient to call back.  If patient call back, please inform:  Labs are essentially normal. There are some minor variations in your blood work that do not require any additional work up at this time. Will continue to monitor. Make sure you are drinking at least 48 oz of water per day. Work on eating a low fat, heart healthy diet and participate in regular aerobic exercise program to control as well. Exercise at least  30 minutes per day-5 days per week. Avoid red meat. No fried foods. No junk foods, sodas, sugary foods or drinks, unhealthy snacking, alcohol or smoking.

## 2018-06-30 ENCOUNTER — Encounter: Payer: Medicaid Other | Admitting: Nurse Practitioner

## 2018-07-09 ENCOUNTER — Ambulatory Visit: Payer: Medicaid Other

## 2018-08-02 ENCOUNTER — Telehealth: Payer: Self-pay | Admitting: Nurse Practitioner

## 2018-08-02 NOTE — Telephone Encounter (Signed)
Pt called to request her lab results, Verified DOB and received note left by Nurse. She had no further questions or concerns

## 2018-08-02 NOTE — Telephone Encounter (Signed)
Noted  

## 2024-01-06 ENCOUNTER — Other Ambulatory Visit: Payer: Self-pay

## 2024-01-06 ENCOUNTER — Emergency Department (HOSPITAL_BASED_OUTPATIENT_CLINIC_OR_DEPARTMENT_OTHER)
Admission: EM | Admit: 2024-01-06 | Discharge: 2024-01-06 | Disposition: A | Discharge status: Home / Self Care | Payer: Self-pay | Attending: Emergency Medicine | Admitting: Emergency Medicine

## 2024-01-06 ENCOUNTER — Encounter (HOSPITAL_BASED_OUTPATIENT_CLINIC_OR_DEPARTMENT_OTHER): Payer: Self-pay | Admitting: *Deleted

## 2024-01-06 DIAGNOSIS — M5431 Sciatica, right side: Secondary | ICD-10-CM

## 2024-01-06 MED ORDER — PREDNISONE 10 MG (21) PO TBPK: ORAL_TABLET | Freq: Every day | ORAL | 0 refills | Status: AC

## 2024-01-06 MED ORDER — KETOROLAC TROMETHAMINE 15 MG/ML IJ SOLN
15.0000 mg | Freq: Once | INTRAMUSCULAR | Status: AC
  Administered 2024-01-06: 15 mg via INTRAMUSCULAR
  Filled 2024-01-06: qty 1

## 2024-01-06 NOTE — ED Triage Notes (Addendum)
 Patient to ED reporting right hip pain radiating down her right leg with intermittent numbness. No discoloration in right leg. Pedal pulse intact. No known injury.   Patient was seen in the ED 2 months ago and told it could be sciatica and managed at home with ibuprofen  but patient reports the pain has been persistent since. Patient also reporting heat in right hip after walking around  [facility] [phone] used for triage.

## 2024-01-06 NOTE — Discharge Instructions (Addendum)
 You were evaluated in the emergency room for low back pain that radiates down your leg.  This is consistent with sciatica.  A prescription for prednisone  was sent into your pharmacy.  Please be sure to take the full course of steroids.  Please call the number on the chart to schedule an appointment with orthopedics.  Le evaluaron en urgencias por dolor lumbar que se irradia a la pierna. Esto es compatible con citica. Le enviaron una receta de prednisona a su farmacia. Asegrese de [employer] con esteroides. Llame al nmero que aparece en la historia clnica para programar una cita con el ortopedista.

## 2024-01-06 NOTE — ED Provider Notes (Signed)
 [place] EMERGENCY DEPARTMENT AT [facility] Provider Note   CSN: [id number] Arrival date & time: 01/06/24  0809     History  Chief Complaint  Patient presents with   Leg Pain    Hannah [name] is a 38 y.o. female presents with right hip and low back pain that radiates down her right leg.  Denies any injury or trauma.  Describes numbness that radiates down her leg.  Had been seen at a another ED 2 months ago, felt to be consistent with sciatica and recommended to take ibuprofen  and use lidocaine  patches.  Patient has been trying this without any significant improvement.  No prior spinal surgeries.  Patient denies any urinary incontinence.  No IV drug use.  Interpreter Janifer Meigs used for examination.   Leg Pain  Past Medical History:  Diagnosis Date   Infection    UTI in early preg   Tuberculosis        Home Medications Prior to Admission medications   Medication Sig Start Date End Date Taking? Authorizing Provider  predniSONE  (STERAPRED UNI-PAK 21 TAB) 10 MG (21) TBPK tablet Take by mouth daily. Take 6 tabs by mouth daily  for 2 days, then 5 tabs for 2 days, then 4 tabs for 2 days, then 3 tabs for 2 days, 2 tabs for 2 days, then 1 tab by mouth daily for 2 days Tome 6 tabletas. por v a oral diariamente durante 2 d as, luego 5 tabletas durante 2 d as, luego 4 tabletas durante 2 d as, luego 3 tabletas durante 2 d as, 2 tabletas durante 2 d as, luego 1 tableta por v a oral diariamente durante 2 d as. 01/06/24  Yes Felicie Horning, PA-C  fluticasone  (FLONASE ) 50 MCG/ACT nasal spray Place 2 sprays into both nostrils daily. Patient not taking: Reported on 06/09/2018 05/13/17   Hassie Lint, PA-C  meclizine  (ANTIVERT ) 25 MG tablet Take 1 tablet (25 mg total) by mouth 3 (three) times daily as needed for dizziness. Patient not taking: Reported on 06/09/2018 05/13/17   Hassie Lint, PA-C  Prenatal Vit-Fe Fumarate-FA (PRENATAL MULTIVITAMIN) TABS tablet Take 1 tablet  by mouth daily at 12 noon.    [provider]  SUMAtriptan  (IMITREX ) 25 MG tablet Take 1 tablet (25 mg total) by mouth every 2 (two) hours as needed for migraine. May repeat in 2 hours if headache persists or recurs. Patient not taking: Reported on 05/13/2017 01/26/17   Marlan Silva, MD      Allergies    Patient has no known allergies.    Review of Systems   Review of Systems  Musculoskeletal:  Positive for myalgias.    Physical Exam Updated Vital Signs BP (!) 142/67   Pulse 70   Temp 98.7 F (37.1 C) (Oral)   Ht 5\' 6"  (1.676 m)   Wt 86.2 kg   SpO2 99%   BMI 30.67 kg/m  Physical Exam Vitals and nursing note reviewed.  Constitutional:      General: She is not in acute distress.    Appearance: She is well-developed.  HENT:     Head: Normocephalic and atraumatic.  Eyes:     Conjunctiva/sclera: Conjunctivae normal.  Cardiovascular:     Pulses: Normal pulses.     Heart sounds: No murmur heard. Pulmonary:     Effort: Pulmonary effort is normal. No respiratory distress.  Musculoskeletal:        General: No swelling.     Cervical back: Neck  supple.     Comments: Mild tenderness to right lower lumbar paraspinal region, no midline tenderness, 5 out of 5 lower extremity strength bilaterally.  Full range of motion of lower extremities.  Intact, positive right straight leg raise.  Is able to ambulate with some discomfort.  DP, PT pulses 2+, compartments soft, no swelling or ecchymosis appreciated  Skin:    General: Skin is warm and dry.     Capillary Refill: Capillary refill takes less than 2 seconds.  Neurological:     Mental Status: She is alert.  Psychiatric:        Mood and Affect: Mood normal.     ED Results / Procedures / Treatments   Labs (all labs ordered are listed, but only abnormal results are displayed) Labs Reviewed - No data to display  EKG None  Radiology No results found.  Procedures Procedures    Medications Ordered in ED Medications   ketorolac  (TORADOL ) 15 MG/ML injection 15 mg (has no administration in time range)    ED Course/ Medical Decision Making/ A&P                                 Medical Decision Making  This patient presents to the ED with chief complaint(s) of back pain.  The complaint involves an extensive differential diagnosis and also carries with it a high risk of complications and morbidity.   pertinent past medical history as listed in HPI  The differential diagnosis includes  Sciatica/spinal radiculopathy, trochanteric bursitis, cauda equina, spinal abscess  Additional history obtained: Records reviewed Care Everywhere/External Records  Initial Assessment:   Hemodynamically stable patient presenting with low back pain with radicular symptoms.  On exam patient was tender right lower lumbar paraspinal region without midline tenderness, positive right straight leg raise. No neurological deficits and normal neuro exam.  Patient can walk but states is painful.  No loss of bowel or bladder control.  No concern for cauda equina.  No red flag symptoms.  Overall most consistent with musculoskeletal etiology such as sciatica.  Will send in prednisone  taper and provide Ortho follow-up.  Toradol  shot given here today.  Do not feel that any further labs or imaging are indicated at this time.  Independent ECG interpretation:  none  Independent labs interpretation:  The following labs were independently interpreted:  none  Independent visualization and interpretation of imaging: none  Treatment and Reassessment: Patient given a shot of Toradol   Consultations obtained:   none  Disposition:   The patient has been appropriately medically screened and/or stabilized in the ED. I have low suspicion for any other emergent medical condition which would require further screening, evaluation or treatment in the ED or require inpatient management. At time of discharge the patient is hemodynamically stable and in  no acute distress. I have discussed work-up results and diagnosis with patient and answered all questions. Patient is agreeable with discharge plan. We discussed strict return precautions for returning to the emergency department and they verbalized understanding.     Social Determinants of Health:   none  This note was dictated with voice recognition software.  Despite best efforts at proofreading, errors may have occurred which can change the documentation meaning.          Final Clinical Impression(s) / ED Diagnoses Final diagnoses:  Sciatica of right side    Rx / DC Orders ED Discharge Orders  Ordered    predniSONE  (STERAPRED UNI-PAK 21 TAB) 10 MG (21) TBPK tablet  Daily        01/06/24 1031              Stanton Earthly 01/06/24 1032    Scarlette Currier, MD 01/07/24 0002

## 2024-03-11 ENCOUNTER — Ambulatory Visit: Admitting: Physician Assistant

## 2024-03-11 ENCOUNTER — Other Ambulatory Visit (INDEPENDENT_AMBULATORY_CARE_PROVIDER_SITE_OTHER): Payer: Self-pay

## 2024-03-11 DIAGNOSIS — G8929 Other chronic pain: Secondary | ICD-10-CM

## 2024-03-11 DIAGNOSIS — M5441 Lumbago with sciatica, right side: Secondary | ICD-10-CM | POA: Diagnosis not present

## 2024-03-11 MED ORDER — TRAMADOL HCL 50 MG PO TABS: 50.0000 mg | ORAL_TABLET | Freq: Two times a day (BID) | ORAL | 1 refills | Status: AC | PRN

## 2024-03-11 MED ORDER — METHOCARBAMOL 750 MG PO TABS: 750.0000 mg | ORAL_TABLET | Freq: Two times a day (BID) | ORAL | 2 refills | Status: AC | PRN

## 2024-03-11 MED ORDER — PREDNISONE 10 MG (21) PO TBPK: ORAL_TABLET | ORAL | 0 refills | Status: AC

## 2024-03-11 NOTE — Progress Notes (Signed)
 Office Visit Note   Patient: Hannah [name]           Date of Birth: 1986/03/06           MRN: [id number] Visit Date: 03/11/2024              Requested by: No referring provider defined for this encounter. PCP: Patient, No Pcp Per   Assessment & Plan: Visit Diagnoses:  1. Chronic right-sided low back pain with right-sided sciatica     Plan: Impression is chronic right-sided low back pain with right lower extremity radiculopathy.  Patient has not yet been to physical therapy.  Currently does not have any red flag symptoms.  I would like to send in a referral to outpatient PT.  I would also like to restart her steroid pack and muscle relaxer.  If her symptoms do not improve over the next 6 to 8 weeks of PT, she will call and we will get an MRI of the lumbar spine to assess for structural abnormalities.  Follow-Up Instructions: Return if symptoms worsen or fail to improve.   Orders:  Orders Placed This Encounter  Procedures   XR Lumbar Spine 2-3 Views   Ambulatory referral to Physical Therapy   Meds ordered this encounter  Medications   traMADol (ULTRAM) 50 MG tablet    Sig: Take 1 tablet (50 mg total) by mouth 2 (two) times daily as needed.    Dispense:  30 tablet    Refill:  1   predniSONE  (STERAPRED UNI-PAK 21 TAB) 10 MG (21) TBPK tablet    Sig: Take as directed on box    Dispense:  21 tablet    Refill:  0   methocarbamol  (ROBAXIN ) 750 MG tablet    Sig: Take 1 tablet (750 mg total) by mouth 2 (two) times daily as needed.    Dispense:  30 tablet    Refill:  2      Procedures: No procedures performed   Clinical Data: No additional findings.   Subjective: Chief Complaint  Patient presents with   Lower Back - Pain    HPI patient is a pleasant 38 year old Spanish-speaking female who is here today with interpreter.  She is here with chronic right-sided low back pain and right lower extremity radiculopathy.  Symptoms began about 4 to 5 months ago without any  previous injury or change in activity.  Symptoms start to the right lower back and radiate down the buttock and down the back of the right leg and into the foot.  Pain is constant but worse with sleeping as well as putting on her shoes.  She notes occasional weakness to the right lower extremity.  She has been taking NSAIDs without relief.  She was given a steroid pack a few months ago which temporarily helped.  She also notes some numbness to the right lower lateral leg.  No bowel or bladder change or saddle paresthesias.  She has not been outpatient physical therapy.  Review of Systems as detailed in HPI.  All others reviewed and are negative.   Objective: Vital Signs: There were no vitals taken for this visit.  Physical Exam well-developed well-nourished female no acute distress.  Alert and oriented x 3.  Ortho Exam lumbar spine exam: She has tenderness to the right lower paraspinous musculature.  Increased pain with lumbar flexion and extension.  Positive straight leg raise on the right.  No focal weakness.  She is neurovascularly intact distally.  Specialty Comments:  No specialty comments available.  Imaging: XR Lumbar Spine 2-3 Views Result Date: 03/11/2024 X-rays demonstrate slight rotational deformity of the lumbar spine.  Otherwise, no acute or structural abnormalities.    PMFS History: Patient Active Problem List   Diagnosis Date Noted   Tension-type headache, not intractable 12/24/2016   Insomnia 12/24/2016   Depression 12/24/2016   Diabetes mellitus screening 11/10/2016   Past Medical History:  Diagnosis Date   Infection    UTI in early preg   Tuberculosis     Family History  Problem Relation Age of Onset   Hearing loss Neg Hx     Past Surgical History:  Procedure Laterality Date   NO PAST SURGERIES     TUBAL LIGATION Bilateral 08/19/2014   Procedure: POST PARTUM TUBAL LIGATION;  Surgeon: Lang JINNY Peel, DO;  Location: [facility];  Service: Gynecology;  Laterality:  Bilateral;   Social History   Occupational History   Not on file  Tobacco Use   Smoking status: Never   Smokeless tobacco: Never  Vaping Use   Vaping status: Never Used  Substance and Sexual Activity   Alcohol use: No   Drug use: No   Sexual activity: Yes    Birth control/protection: None

## 2024-03-19 ENCOUNTER — Emergency Department (HOSPITAL_BASED_OUTPATIENT_CLINIC_OR_DEPARTMENT_OTHER)
Admission: EM | Admit: 2024-03-19 | Discharge: 2024-03-19 | Disposition: A | Discharge status: Home / Self Care | Attending: Emergency Medicine | Admitting: Emergency Medicine

## 2024-03-19 ENCOUNTER — Encounter (HOSPITAL_BASED_OUTPATIENT_CLINIC_OR_DEPARTMENT_OTHER): Payer: Self-pay | Admitting: Emergency Medicine

## 2024-03-19 ENCOUNTER — Other Ambulatory Visit (HOSPITAL_BASED_OUTPATIENT_CLINIC_OR_DEPARTMENT_OTHER): Payer: Self-pay

## 2024-03-19 ENCOUNTER — Other Ambulatory Visit: Payer: Self-pay

## 2024-03-19 DIAGNOSIS — M5441 Lumbago with sciatica, right side: Secondary | ICD-10-CM | POA: Diagnosis not present

## 2024-03-19 DIAGNOSIS — M545 Low back pain, unspecified: Secondary | ICD-10-CM | POA: Diagnosis present

## 2024-03-19 DIAGNOSIS — M5431 Sciatica, right side: Secondary | ICD-10-CM

## 2024-03-19 MED ORDER — KETOROLAC TROMETHAMINE 15 MG/ML IJ SOLN
15.0000 mg | Freq: Once | INTRAMUSCULAR | Status: AC
  Administered 2024-03-19: 15 mg via INTRAMUSCULAR
  Filled 2024-03-19: qty 1

## 2024-03-19 MED ORDER — LIDOCAINE 5 % EX PTCH
1.0000 | MEDICATED_PATCH | CUTANEOUS | 0 refills | Status: AC
  Filled 2024-03-19: qty 10, 10d supply, fill #0

## 2024-03-19 MED ORDER — IBUPROFEN 600 MG PO TABS
600.0000 mg | ORAL_TABLET | Freq: Three times a day (TID) | ORAL | 0 refills | Status: AC
  Filled 2024-03-19: qty 21, 7d supply, fill #0

## 2024-03-19 NOTE — Discharge Instructions (Signed)
 You can take these medications with your previously prescribed medications.  Follow-up with your spine doctor as planned for further treatment

## 2024-03-19 NOTE — ED Triage Notes (Signed)
 Pt has been having lower back pain and going down her right leg. She was seen 8 days ago by ortho, had xray, told she has inflammation, sent home on robaxin  and tramadol  which does not help. Has PT starting Wednesday. Here for pain management

## 2024-03-19 NOTE — ED Provider Notes (Signed)
 [place] EMERGENCY DEPARTMENT AT [facility] Provider Note   CSN: [id number] Arrival date & time: 03/19/24  9178     Patient presents with: No chief complaint on file.   Hannah [name] is a 38 y.o. female.   HPI   Patient has a history of chronic back pain.  Patient states she has been having the symptoms for several months.  Patient was seen in the emergency room on April 23 and was diagnosed with sciatica.  Patient has subsequently followed up with an orthopedic doctor.  She was seen in the orthopedist office on June 27.  Patient has been diagnosed with sciatica.  She was given prescriptions for Robaxin  Ultram  and also took a steroid pack.  Patient does not feel like the symptoms are getting any better.  She is not having any fevers or chills.  She is not having any difficulty urinating.  She denies any numbness or weakness.  The pain is in the lower back but does go down her right leg.  Patient is scheduled for physical therapy but was hoping to get something different for pain.  She was seen in the emergency room once before and was given a shot which helped.  Prior to Admission medications   Medication Sig Start Date End Date Taking? Authorizing Provider  ibuprofen  (ADVIL ) 600 MG tablet Take 1 tablet (600 mg total) by mouth 3 (three) times daily. 03/19/24  Yes Randol Simmonds, MD  lidocaine  (LIDODERM ) 5 % Place 1 patch onto the skin daily. Remove & Discard patch within 12 hours or as directed by MD 03/19/24  Yes Randol Simmonds, MD  fluticasone  (FLONASE ) 50 MCG/ACT nasal spray Place 2 sprays into both nostrils daily. Patient not taking: Reported on 06/09/2018 05/13/17   McClung, Angela M, PA-C  meclizine  (ANTIVERT ) 25 MG tablet Take 1 tablet (25 mg total) by mouth 3 (three) times daily as needed for dizziness. Patient not taking: Reported on 06/09/2018 05/13/17   Danton Eisen Robenson HERO, PA-C  methocarbamol  (ROBAXIN ) 750 MG tablet Take 1 tablet (750 mg total) by mouth 2 (two) times daily as  needed. 03/11/24   Jule Ronal CROME, PA-C  predniSONE  (STERAPRED UNI-PAK 21 TAB) 10 MG (21) TBPK tablet Take by mouth daily. Take 6 tabs by mouth daily  for 2 days, then 5 tabs for 2 days, then 4 tabs for 2 days, then 3 tabs for 2 days, 2 tabs for 2 days, then 1 tab by mouth daily for 2 days Tome 6 tabletas. por v a oral diariamente durante 2 d as, luego 5 tabletas durante 2 d as, luego 4 tabletas durante 2 d as, luego 3 tabletas durante 2 d as, 2 tabletas durante 2 d as, luego 1 tableta por v a oral diariamente durante 2 d as. 01/06/24   Donnajean Lynwood DEL, PA-C  predniSONE  (STERAPRED UNI-PAK 21 TAB) 10 MG (21) TBPK tablet Take as directed on box 03/11/24   Jule Ronal CROME, PA-C  Prenatal Vit-Fe Fumarate-FA (PRENATAL MULTIVITAMIN) TABS tablet Take 1 tablet by mouth daily at 12 noon.    [provider]  SUMAtriptan  (IMITREX ) 25 MG tablet Take 1 tablet (25 mg total) by mouth every 2 (two) hours as needed for migraine. May repeat in 2 hours if headache persists or recurs. Patient not taking: Reported on 05/13/2017 01/26/17   Marilyne Stephane DASEN, MD  traMADol  (ULTRAM ) 50 MG tablet Take 1 tablet (50 mg total) by mouth 2 (two) times daily as needed. 03/11/24   Jule Ronal  L, PA-C    Allergies: Patient has no known allergies.    Review of Systems  Updated Vital Signs BP (!) 159/105   Pulse 67   Temp 98.5 F (36.9 C)   Resp 16   SpO2 100%   Physical Exam Vitals and nursing note reviewed.  Constitutional:      General: She is not in acute distress.    Appearance: She is well-developed.  HENT:     Head: Normocephalic and atraumatic.     Right Ear: External ear normal.     Left Ear: External ear normal.  Eyes:     General: No scleral icterus.       Right eye: No discharge.        Left eye: No discharge.     Conjunctiva/sclera: Conjunctivae normal.  Neck:     Trachea: No tracheal deviation.  Cardiovascular:     Rate and Rhythm: Normal rate.  Pulmonary:     Effort: Pulmonary effort  is normal. No respiratory distress.     Breath sounds: No stridor.  Abdominal:     General: There is no distension.     Tenderness: There is no abdominal tenderness.  Musculoskeletal:        General: Tenderness present. No swelling or deformity.     Cervical back: Neck supple.     Comments: Tenderness palpation paraspinal region lumbar spine,  Skin:    General: Skin is warm and dry.     Findings: No rash.  Neurological:     Mental Status: She is alert. Mental status is at baseline.     Cranial Nerves: No dysarthria or facial asymmetry.     Sensory: No sensory deficit.     Motor: No weakness or seizure activity.     Comments: Normal strength and sensation, no weakness in the lower extremities 5 out of 5 plantarflexion dorsiflexion strength     (all labs ordered are listed, but only abnormal results are displayed) Labs Reviewed - No data to display  EKG: None  Radiology: No results found.   Procedures   Medications Ordered in the ED  ketorolac  (TORADOL ) 15 MG/ML injection 15 mg (has no administration in time range)                                    Medical Decision Making Problems Addressed: Sciatica, right side: acute illness or injury that poses a threat to life or bodily functions  Risk Prescription drug management.   Prior records were reviewed.  Patient did have an injection of Toradol .  Will give her the same here in the ED.  Patient has been having chronic back pain.  She did have outpatient x-rays on June 27.  Patient does not have any signs to suggest acute infection.  Patient is not having any abdominal pain to suggest any referred pain.  She does not have any acute neurologic deficits requiring emergent spinal imaging.  Suspect her symptoms are related to sciatica.  Will give an additional dose of NSAIDs lidocaine  patches.  She can continue her Ultram  and Robaxin .     Final diagnoses:  Sciatica, right side    ED Discharge Orders          Ordered     ibuprofen  (ADVIL ) 600 MG tablet  3 times daily        03/19/24 0902    lidocaine  (LIDODERM ) 5 %  Every  24 hours        03/19/24 0902               Randol Simmonds, MD 03/19/24 [phone]

## 2024-03-19 NOTE — ED Notes (Signed)
 Pt d/c instructions, medications, and follow-up care reviewed with pt and family. Pt and family verbalized understanding and had no further questions at time of d/c. Pt CA&Ox4, ambulatory, and in NAD at time of d/c

## 2024-03-22 NOTE — Therapy (Incomplete)
 OUTPATIENT PHYSICAL THERAPY THORACOLUMBAR EVALUATION   Patient Name: Hannah [name] MRN: [id number] DOB:[birth year], 38 y.o., female Today's Date: 03/22/2024  END OF SESSION:   Past Medical History:  Diagnosis Date   Infection    UTI in early preg   Tuberculosis    Past Surgical History:  Procedure Laterality Date   NO PAST SURGERIES     TUBAL LIGATION Bilateral 08/19/2014   Procedure: POST PARTUM TUBAL LIGATION;  Surgeon: Lang JINNY Peel, DO;  Location: [facility];  Service: Gynecology;  Laterality: Bilateral;   Patient Active Problem List   Diagnosis Date Noted   Tension-type headache, not intractable 12/24/2016   Insomnia 12/24/2016   Depression 12/24/2016   Diabetes mellitus screening 11/10/2016    PCP: No PCP  REFERRING PROVIDER: Jule Ronal CROME, PA-C   REFERRING DIAG: [phone] (ICD-10-CM) - Chronic right-sided low back pain with right-sided sciatica  Rationale for Evaluation and Treatment: Rehabilitation  THERAPY DIAG:  No diagnosis found.  ONSET DATE: ***  SUBJECTIVE:                                                                                                                                                                                           SUBJECTIVE STATEMENT: ***  PERTINENT HISTORY:  ***  PAIN:  Are you having pain? Yes: NPRS scale: *** Pain location: *** Pain description: *** Aggravating factors: *** Relieving factors: ***  PRECAUTIONS: {Therapy precautions:24002}  RED FLAGS: {PT Red Flags:29287}   WEIGHT BEARING RESTRICTIONS: {Yes ***/No:24003}  FALLS:  Has patient fallen in last 6 months? {fallsyesno:27318}  LIVING ENVIRONMENT: Lives with: {[facility] lives with:25569::lives with their family} Lives in: {Lives in:25570} Stairs: {opstairs:27293} Has following equipment at home: {Assistive devices:23999}  OCCUPATION: ***  PLOF: {PLOF:24004}  PATIENT GOALS: ***  NEXT MD VISIT: ***  OBJECTIVE:  Note: Objective  measures were completed at Evaluation unless otherwise noted.  DIAGNOSTIC FINDINGS:  03/11/24 X-rays demonstrate slight rotational deformity of the lumbar spine.   Otherwise, no acute or structural abnormalities.   PATIENT SURVEYS:  {rehab surveys:24030}  COGNITION: Overall cognitive status: {cognition:24006}     SENSATION: {sensation:27233}  MUSCLE LENGTH: Hamstrings: Right *** deg; Left *** deg Debby test: Right *** deg; Left *** deg  POSTURE: {posture:25561}  PALPATION: ***  LUMBAR ROM:   AROM eval  Flexion   Extension   Right lateral flexion   Left lateral flexion   Right rotation   Left rotation    (Blank rows = not tested)  LOWER EXTREMITY ROM:     {AROM/PROM:27142}  Right eval Left eval  Hip flexion    Hip extension    Hip abduction  Hip adduction    Hip internal rotation    Hip external rotation    Knee flexion    Knee extension    Ankle dorsiflexion    Ankle plantarflexion    Ankle inversion    Ankle eversion     (Blank rows = not tested)  LOWER EXTREMITY MMT:    MMT Right eval Left eval  Hip flexion    Hip extension    Hip abduction    Hip adduction    Hip internal rotation    Hip external rotation    Knee flexion    Knee extension    Ankle dorsiflexion    Ankle plantarflexion    Ankle inversion    Ankle eversion     (Blank rows = not tested)  LUMBAR SPECIAL TESTS:  {lumbar special test:25242}  FUNCTIONAL TESTS:  {Functional tests:24029}  GAIT: Distance walked: *** Assistive device utilized: {Assistive devices:23999} Level of assistance: {Levels of assistance:24026} Comments: ***  TREATMENT DATE:  [facility] Adult PT Treatment:                                                DATE: 03/23/24 Therapeutic Exercise: *** Manual Therapy: *** Neuromuscular re-ed: *** Therapeutic Activity: *** Modalities: *** Self Care: ***                                                                                                                                   PATIENT EDUCATION:  Education details: *** Person educated: {Person educated:25204} Education method: {Education Method:25205} Education comprehension: {Education Comprehension:25206}  HOME EXERCISE PROGRAM: ***  ASSESSMENT:  CLINICAL IMPRESSION: Patient is a 38 y.o. female who was seen today for physical therapy evaluation and treatment for M54.41,G89.29 (ICD-10-CM) - Chronic right-sided low back pain with right-sided sciatica.   OBJECTIVE IMPAIRMENTS: {opptimpairments:25111}.   ACTIVITY LIMITATIONS: {activitylimitations:27494}  PARTICIPATION LIMITATIONS: {participationrestrictions:25113}  PERSONAL FACTORS: {Personal factors:25162} are also affecting patient's functional outcome.   REHAB POTENTIAL: {rehabpotential:25112}  CLINICAL DECISION MAKING: {clinical decision making:25114}  EVALUATION COMPLEXITY: {Evaluation complexity:25115}   GOALS: Goals reviewed with patient? {yes/no:20286}  SHORT TERM GOALS: Target date: ***  *** Baseline: Goal status: INITIAL  2.  *** Baseline:  Goal status: INITIAL  3.  *** Baseline:  Goal status: INITIAL  4.  *** Baseline:  Goal status: INITIAL  5.  *** Baseline:  Goal status: INITIAL  6.  *** Baseline:  Goal status: INITIAL  LONG TERM GOALS: Target date: ***  *** Baseline:  Goal status: INITIAL  2.  *** Baseline:  Goal status: INITIAL  3.  *** Baseline:  Goal status: INITIAL  4.  *** Baseline:  Goal status: INITIAL  5.  *** Baseline:  Goal status: INITIAL  6.  *** Baseline:  Goal status: INITIAL  PLAN:  PT FREQUENCY: {rehab frequency:25116}  PT DURATION: {rehab duration:25117}  PLANNED INTERVENTIONS: {rehab planned interventions:25118::97110-Therapeutic exercises,97530- Therapeutic [phone]- Neuromuscular re-education,97535- Self Rjmz,02859- Manual therapy}.  PLAN FOR NEXT SESSION: ***   Dasie Daft, PT 03/22/2024, 4:21 PM

## 2024-03-23 ENCOUNTER — Ambulatory Visit

## 2024-03-29 NOTE — Therapy (Signed)
 OUTPATIENT PHYSICAL THERAPY THORACOLUMBAR EVALUATION   Patient Name: Hannah [name] MRN: [id number] DOB:1985/12/18, 38 y.o., female Today's Date: 03/30/2024  END OF SESSION:  PT End of Session - 03/30/24 1446     Visit Number 1    Number of Visits 13    Date for PT Re-Evaluation 05/11/24    Authorization Type [employer]    Authorization Time Period auth tbd    PT Start Time 1447    PT Stop Time 1530    PT Time Calculation (min) 43 min          Past Medical History:  Diagnosis Date   Infection    UTI in early preg   Tuberculosis    Past Surgical History:  Procedure Laterality Date   NO PAST SURGERIES     TUBAL LIGATION Bilateral 08/19/2014   Procedure: POST PARTUM TUBAL LIGATION;  Surgeon: Lang JINNY Peel, DO;  Location: [facility];  Service: Gynecology;  Laterality: Bilateral;   Patient Active Problem List   Diagnosis Date Noted   Tension-type headache, not intractable 12/24/2016   Insomnia 12/24/2016   Depression 12/24/2016   Diabetes mellitus screening 11/10/2016    PCP: No PCP in chart  REFERRING PROVIDER: Jule Ronal CROME, PA-C  REFERRING DIAG: [phone] (ICD-10-CM) - Chronic right-sided low back pain with right-sided sciatica  Rationale for Evaluation and Treatment: Rehabilitation  THERAPY DIAG:  No diagnosis found.  ONSET DATE: 5-6 months  SUBJECTIVE:                                                                                                                                                                                           SUBJECTIVE STATEMENT: Accompanied by daughter per pt request. Appreciate assistance of video interpreter although there are some initial difficulties getting set up. Pt reports onset of pain 5-6 months ago without MOI or clear change in activity. 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 States symptoms have  improved somewhat since onset but remain severe. Has been to ED a few times and states she was told she has sciatica. Describes R back + LE symptoms that go from hip, posterolateral thigh into calf/heel.  No L sided symptoms. Describes pain as electric shock at times but doesn't endorse clear N/T. Denies any saddle anesthesia or sensory changes. Denies any issues w/ bowel/bladder, although she does endorse some abdominal pain which she states she has reached out to provider about.    PERTINENT HISTORY:  hx TB, insomnia, depression, headache   PAIN:  Are you having pain: 8/10, variable Location/description: R sided low back into thigh, posterior calf/heel; throbbing - aggravating factors: driving, sitting >89fpw, sleeping - Easing factors: movement changes, walking, lying down, heating pad   PRECAUTIONS: None  RED FLAGS: none   WEIGHT BEARING RESTRICTIONS: No  FALLS:  Has patient fallen in last 6 months? 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  DIAGNOSTIC FINDINGS:  03/11/24 Lumbar XR: X-rays demonstrate slight rotational deformity of the lumbar spine.   Otherwise, no acute or structural abnormalities.   PATIENT SURVEYS:  ODI: 35/50  COGNITION: Overall cognitive status: Within functional limits for tasks assessed     SENSATION: Does not endorse any sensory complaints    POSTURE: lateral shift towards L, posterior lean   LUMBAR ROM:   AROM eval  Flexion Knee, *  Extension 75%   Right lateral flexion   Left lateral flexion   Right rotation 75% *  Left rotation 75% *    (Blank rows = not tested) (Key: WFL = within functional limits not formally assessed, * = concordant pain, s = stiffness/stretching sensation, NT = not tested) Comment: second attempt at  flexion with mild improvement after repeated ext x5  LOWER EXTREMITY MMT:    MMT Right eval Left eval  Hip flexion 4- 4  Hip abduction (modified sitting)    Hip internal rotation    Hip external rotation    Knee flexion 4- * 4+  Knee extension 4- * 4+  Ankle dorsiflexion 4+ 4+   (Blank rows = not tested) (Key: WFL = within functional limits not formally assessed, * = concordant pain, s = stiffness/stretching sensation, NT = not tested)  Comments:    LUMBAR SPECIAL TESTS:  Mild improvement in flexion tolerance following repeated ext  FUNCTIONAL TESTS:  5xSTS: 34.14sec UE support from thighs  GAIT: Distance walked: within clinic Assistive device utilized: None Level of assistance: Complete Independence Comments: mildly reduced stance time RLE, reduced truncal rotation and arm swing  TREATMENT DATE:  [facility] Adult PT Treatment:                                                DATE: 03/30/24 deferred                                                                                                                        PATIENT EDUCATION:  Education details: Pt education on PT impairments, prognosis, and POC. Informed consent. Rationale for interventions, safe/appropriate HEP performance Person educated: Patient Education method: Explanation, Demonstration, Tactile cues, Verbal cues Education comprehension: verbalized understanding, returned demonstration, verbal cues required, tactile cues required, and needs further education    HOME  EXERCISE PROGRAM: deferred  ASSESSMENT:  CLINICAL IMPRESSION: Patient is a pleasant 38 y.o. woman who was seen today for physical therapy evaluation and treatment for back pain ongoing over past few months. Increased time is required for exam/subjective given initial difficulties w/ video interpreting services. 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 Does  not endorse any red flags. On exam she demonstrates good ROM of lumbar spine overall, although it is painful particularly into flexion. Flexion tolerance is modestly improved after repeated extension. LE strength testing is limited by pain. 5xSTS indicative of reduced mobility and fall risk. No adverse events, tolerates session well overall but does have fairly consistent pain levels throughout. Recommend trial of skilled PT to address aforementioned deficits with aim of improving functional tolerance and reducing pain with typical activities. Pt departs today's session in no acute distress, all voiced concerns/questions addressed appropriately from PT perspective.    OBJECTIVE IMPAIRMENTS: decreased activity tolerance, decreased endurance, decreased mobility, difficulty walking, decreased ROM, decreased strength, impaired perceived functional ability, improper body mechanics, postural dysfunction, and pain.   ACTIVITY LIMITATIONS: carrying, lifting, bending, sitting, standing, squatting, sleeping, stairs, transfers, locomotion level, and caring for others  PARTICIPATION LIMITATIONS: meal prep, cleaning, laundry, community activity, and occupation  PERSONAL FACTORS: Time since onset of injury/illness/exacerbation and 3+ comorbidities: hx TB, insomnia, depression, headache are also affecting patient's functional outcome.   REHAB POTENTIAL: Good  CLINICAL DECISION MAKING: Evolving/moderate complexity  EVALUATION COMPLEXITY: Moderate   GOALS:   SHORT TERM GOALS: Target date: 04/20/2024  Pt will demonstrate appropriate understanding and performance of initially prescribed HEP in order to facilitate improved independence with management of symptoms.  Baseline: HEP TBD  Goal status: INITIAL   2. Pt will report at least 25% improvement in overall pain levels over past week in order to facilitate improved tolerance to typical daily activities.   Baseline: 8/10 on eval  Goal status: INITIAL     LONG TERM GOALS: Target date: 05/11/2024   Pt will improve at least 20% on ODI in order to demonstrate improved perception of functional status due to symptoms.  Baseline: 32/50, 64% Goal status: INITIAL  2.  Pt will demonstrate lumbar flexion AROM to at least distal shin with less than 3 pt increase in pain in order to demonstrate improved tolerance to functional movement patterns.   Baseline: see ROM chart above Goal status: INITIAL  3.  Pt will demonstrate bilateral hip/knee MMT of at least 4+/5 in order to demonstrate improved strength for functional movements.  Baseline: see MMT chart above Goal status: INITIAL  4. Pt will perform 5xSTS in <18 sec in order to demonstrate reduced fall risk and improved functional independence. (MCID of 2.3sec)  Baseline: 34sec UE support  Goal status: INITIAL   5. Pt will be able to tolerate sitting for >45 min in order to improve driving tolerance.  Baseline: pain >57min  Goal status: INITIAL  PLAN:  PT FREQUENCY: 2x/week  PT DURATION: 6 weeks  PLANNED INTERVENTIONS: 97164- PT Re-evaluation, 97750- Physical Performance Testing, 97110-Therapeutic exercises, 97530- Therapeutic activity, 97112- Neuromuscular re-education, 97535- Self Care, 02859- Manual therapy, [phone]- Gait training, [phone]- Aquatic Therapy, [phone] (1-2 muscles), 20561 (3+ muscles)- Dry Needling, Patient/Family education, Balance training, Stair training, Taping, Joint mobilization, Joint manipulation, Spinal mobilization, Cryotherapy, and Moist heat.  PLAN FOR NEXT SESSION: establish HEP. Responds well to repeated extension on eval. Consider looking at slump test and sciatic nerve glides within pt  tolerance. LE/core stability training. Symptom modification strategies as indicated/appropriate.    Alm DELENA Jenny PT, DPT 03/30/2024 5:38 PM   For all possible CPT codes, reference the Planned Interventions line above.     Check all conditions that are expected to impact  treatment: {Conditions expected to impact treatment:Musculoskeletal disorders

## 2024-03-30 ENCOUNTER — Encounter: Payer: Self-pay | Admitting: Physical Therapy

## 2024-03-30 ENCOUNTER — Other Ambulatory Visit: Payer: Self-pay

## 2024-03-30 ENCOUNTER — Ambulatory Visit: Attending: Physician Assistant | Admitting: Physical Therapy

## 2024-03-30 DIAGNOSIS — M5459 Other low back pain: Secondary | ICD-10-CM | POA: Insufficient documentation

## 2024-03-30 DIAGNOSIS — G8929 Other chronic pain: Secondary | ICD-10-CM | POA: Diagnosis not present

## 2024-03-30 DIAGNOSIS — M5441 Lumbago with sciatica, right side: Secondary | ICD-10-CM | POA: Insufficient documentation

## 2024-04-06 ENCOUNTER — Ambulatory Visit

## 2024-04-06 DIAGNOSIS — M5459 Other low back pain: Secondary | ICD-10-CM

## 2024-04-06 NOTE — Therapy (Signed)
 OUTPATIENT PHYSICAL THERAPY TREATMENT NOTE   Patient Name: Hannah [name] MRN: [id number] DOB:Jan 29, 1986, 38 y.o., female Today's Date: 04/06/2024  END OF SESSION:  PT End of Session - 04/06/24 1433     Visit Number 2    Number of Visits 13    Date for PT Re-Evaluation 05/11/24    Authorization Type [employer]    Authorization Time Period 9 visits approved 03/30/24-05/28/24    Authorization - Visit Number 1    Authorization - Number of Visits 9    PT Start Time 1445    PT Stop Time 1523    PT Time Calculation (min) 38 min    Activity Tolerance Patient tolerated treatment well    Behavior During Therapy WFL for tasks assessed/performed           Past Medical History:  Diagnosis Date   Infection    UTI in early preg   Tuberculosis    Past Surgical History:  Procedure Laterality Date   NO PAST SURGERIES     TUBAL LIGATION Bilateral 08/19/2014   Procedure: POST PARTUM TUBAL LIGATION;  Surgeon: Lang JINNY Peel, DO;  Location: [facility];  Service: Gynecology;  Laterality: Bilateral;   Patient Active Problem List   Diagnosis Date Noted   Tension-type headache, not intractable 12/24/2016   Insomnia 12/24/2016   Depression 12/24/2016   Diabetes mellitus screening 11/10/2016    PCP: No PCP in chart  REFERRING PROVIDER: Jule Ronal CROME, PA-C  REFERRING DIAG: [phone] (ICD-10-CM) - Chronic right-sided low back pain with right-sided sciatica  Rationale for Evaluation and Treatment: Rehabilitation  THERAPY DIAG:  Other low back pain  ONSET DATE: 5-6 months  SUBJECTIVE:                                                                                                                                                                                           SUBJECTIVE STATEMENT: Patient reports that her pain is at a 9/10 today and that she continues to have pain from her lower back down to her R ankle.  EVAL: Accompanied by daughter per pt request.  Appreciate assistance of video interpreter although there are some initial difficulties getting set up. Pt reports onset of pain 5-6 months ago without MOI or clear change in activity. 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 States symptoms have improved somewhat since onset but remain severe. Has been to ED a few times and states she was told she has sciatica. Describes R back + LE symptoms  that go from hip, posterolateral thigh into calf/heel.  No L sided symptoms. Describes pain as electric shock at times but doesn't endorse clear N/T. Denies any saddle anesthesia or sensory changes. Denies any issues w/ bowel/bladder, although she does endorse some abdominal pain which she states she has reached out to provider about.   PERTINENT HISTORY:  hx TB, insomnia, depression, headache   PAIN:  Are you having pain: 8/10, variable Location/description: R sided low back into thigh, posterior calf/heel; throbbing - aggravating factors: driving, sitting >89fpw, sleeping - Easing factors: movement changes, walking, lying down, heating pad   PRECAUTIONS: None  RED FLAGS: none   WEIGHT BEARING RESTRICTIONS: No  FALLS:  Has patient fallen in last 6 months? 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  DIAGNOSTIC FINDINGS:  03/11/24 Lumbar XR: X-rays demonstrate slight rotational deformity of the lumbar spine.   Otherwise, no acute or structural abnormalities.   PATIENT SURVEYS:  ODI: 35/50  COGNITION: Overall cognitive status: Within functional limits for tasks assessed     SENSATION: Does not endorse any sensory complaints    POSTURE: lateral shift towards L, posterior lean   LUMBAR  ROM:   AROM eval  Flexion Knee, *  Extension 75%   Right lateral flexion   Left lateral flexion   Right rotation 75% *  Left rotation 75% *    (Blank rows = not tested) (Key: WFL = within functional limits not formally assessed, * = concordant pain, s = stiffness/stretching sensation, NT = not tested) Comment: second attempt at flexion with mild improvement after repeated ext x5  LOWER EXTREMITY MMT:    MMT Right eval Left eval  Hip flexion 4- 4  Hip abduction (modified sitting)    Hip internal rotation    Hip external rotation    Knee flexion 4- * 4+  Knee extension 4- * 4+  Ankle dorsiflexion 4+ 4+   (Blank rows = not tested) (Key: WFL = within functional limits not formally assessed, * = concordant pain, s = stiffness/stretching sensation, NT = not tested)  Comments:    LUMBAR SPECIAL TESTS:  Mild improvement in flexion tolerance following repeated ext 04/06/24: Slump test: Positive BIL (Pain on right when performed on Left)  FUNCTIONAL TESTS:  5xSTS: 34.14sec UE support from thighs  GAIT: Distance walked: within clinic Assistive device utilized: None Level of assistance: Complete Independence Comments: mildly reduced stance time RLE, reduced truncal rotation and arm swing  TREATMENT DATE:  [facility] Adult PT Treatment:                                                DATE: 04/06/24 Neuromuscular re-ed: Seated sciatic nerve flossing with ankle DF/PF with heel supported on floor x10 (verbal instruction for HEP) Seated thoracic extension in chair 2x10 Supine PPT 5 hold 2x10 Therapeutic Activity: Slump test performed BIL, results above Log rolling technique for supine<->sit Self Care/Home Management: Pain education and management strategies, importance of completing HEP, performing HEP daily (up to 2x a day if feeling ok)   [facility] Adult PT Treatment:  DATE: 03/30/24 deferred                                                                                                                         PATIENT EDUCATION:  Education details: Pt education on PT impairments, prognosis, and POC. Informed consent. Rationale for interventions, safe/appropriate HEP performance Person educated: Patient Education method: Explanation, Demonstration, Tactile cues, Verbal cues Education comprehension: verbalized understanding, returned demonstration, verbal cues required, tactile cues required, and needs further education    HOME EXERCISE PROGRAM: Access Code: [email or web address] URL: [email or web address] Date: 04/06/2024 Prepared by: Corean Pouch  Exercises - Seated Slump Nerve Glide  - 1 x daily - 7 x weekly - 2 sets - 10 reps (instructed in way performed in the clinic with heel supported and moving through PF/DF in tolerable range) - Supine Posterior Pelvic Tilt  - 1 x daily - 7 x weekly - 2 sets - 10 reps  ASSESSMENT:  CLINICAL IMPRESSION: Patient presents to first follow up PT session reporting continued pain and radicular symptoms. Performed slump test with positive results listed above. With performance of sciatic nerve flossing and repeated thoracic extension in seated she reports some improvements in symptoms. Provided HEP with patient demonstrating understanding of each exercise with modification of sciatic glide to keeping heel on the ground. Patient continues to benefit from skilled PT services and should be progressed as able to improve functional independence.   EVAL: Patient is a pleasant 38 y.o. woman who was seen today for physical therapy evaluation and treatment for back pain ongoing over past few months. Increased time is required for exam/subjective given initial difficulties w/ video interpreting services. 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 Does not endorse any red flags. On exam she demonstrates good ROM  of lumbar spine overall, although it is painful particularly into flexion. Flexion tolerance is modestly improved after repeated extension. LE strength testing is limited by pain. 5xSTS indicative of reduced mobility and fall risk. No adverse events, tolerates session well overall but does have fairly consistent pain levels throughout. Recommend trial of skilled PT to address aforementioned deficits with aim of improving functional tolerance and reducing pain with typical activities. Pt departs today's session in no acute distress, all voiced concerns/questions addressed appropriately from PT perspective.    OBJECTIVE IMPAIRMENTS: decreased activity tolerance, decreased endurance, decreased mobility, difficulty walking, decreased ROM, decreased strength, impaired perceived functional ability, improper body mechanics, postural dysfunction, and pain.   ACTIVITY LIMITATIONS: carrying, lifting, bending, sitting, standing, squatting, sleeping, stairs, transfers, locomotion level, and caring for others  PARTICIPATION LIMITATIONS: meal prep, cleaning, laundry, community activity, and occupation  PERSONAL FACTORS: Time since onset of injury/illness/exacerbation and 3+ comorbidities: hx TB, insomnia, depression, headache are also affecting patient's functional outcome.   REHAB POTENTIAL: Good  CLINICAL DECISION MAKING: Evolving/moderate complexity  EVALUATION COMPLEXITY: Moderate   GOALS:   SHORT TERM GOALS: Target date: 04/20/2024  Pt will demonstrate  appropriate understanding and performance of initially prescribed HEP in order to facilitate improved independence with management of symptoms.  Baseline: HEP TBD  Goal status: INITIAL   2. Pt will report at least 25% improvement in overall pain levels over past week in order to facilitate improved tolerance to typical daily activities.   Baseline: 8/10 on eval  Goal status: INITIAL    LONG TERM GOALS: Target date: 05/11/2024   Pt will improve at  least 20% on ODI in order to demonstrate improved perception of functional status due to symptoms.  Baseline: 32/50, 64% Goal status: INITIAL  2.  Pt will demonstrate lumbar flexion AROM to at least distal shin with less than 3 pt increase in pain in order to demonstrate improved tolerance to functional movement patterns.   Baseline: see ROM chart above Goal status: INITIAL  3.  Pt will demonstrate bilateral hip/knee MMT of at least 4+/5 in order to demonstrate improved strength for functional movements.  Baseline: see MMT chart above Goal status: INITIAL  4. Pt will perform 5xSTS in <18 sec in order to demonstrate reduced fall risk and improved functional independence. (MCID of 2.3sec)  Baseline: 34sec UE support  Goal status: INITIAL   5. Pt will be able to tolerate sitting for >45 min in order to improve driving tolerance.  Baseline: pain >3min  Goal status: INITIAL  PLAN:  PT FREQUENCY: 2x/week  PT DURATION: 6 weeks  PLANNED INTERVENTIONS: 97164- PT Re-evaluation, 97750- Physical Performance Testing, 97110-Therapeutic exercises, 97530- Therapeutic activity, 97112- Neuromuscular re-education, 97535- Self Care, 02859- Manual therapy, [phone]- Gait training, [phone]- Aquatic Therapy, [phone] (1-2 muscles), 20561 (3+ muscles)- Dry Needling, Patient/Family education, Balance training, Stair training, Taping, Joint mobilization, Joint manipulation, Spinal mobilization, Cryotherapy, and Moist heat.  PLAN FOR NEXT SESSION: establish HEP. Responds well to repeated extension on eval. Consider looking at slump test and sciatic nerve glides within pt tolerance. LE/core stability training. Symptom modification strategies as indicated/appropriate.    Corean Pouch PTA  04/06/2024 4:05 PM   For all possible CPT codes, reference the Planned Interventions line above.     Check all conditions that are expected to impact treatment: {Conditions expected to impact treatment:Musculoskeletal  disorders

## 2024-04-07 ENCOUNTER — Ambulatory Visit

## 2024-04-07 DIAGNOSIS — M5459 Other low back pain: Secondary | ICD-10-CM | POA: Diagnosis not present

## 2024-04-07 NOTE — Therapy (Signed)
 OUTPATIENT PHYSICAL THERAPY TREATMENT NOTE   Patient Name: Hannah [name] MRN: [id number] DOB:[birth year], 38 y.o., female Today's Date: 04/07/2024  END OF SESSION:  PT End of Session - 04/07/24 0915     Visit Number 3    Number of Visits 13    Date for PT Re-Evaluation 05/11/24    Authorization Type [employer]    Authorization Time Period 9 visits approved 03/30/24-05/28/24    Authorization - Visit Number 2    Authorization - Number of Visits 9    PT Start Time 0915    PT Stop Time 0953    PT Time Calculation (min) 38 min    Activity Tolerance Patient tolerated treatment well    Behavior During Therapy [facility] for tasks assessed/performed            Past Medical History:  Diagnosis Date   Infection    UTI in early preg   Tuberculosis    Past Surgical History:  Procedure Laterality Date   NO PAST SURGERIES     TUBAL LIGATION Bilateral 08/19/2014   Procedure: POST PARTUM TUBAL LIGATION;  Surgeon: Lang JINNY Peel, DO;  Location: [facility];  Service: Gynecology;  Laterality: Bilateral;   Patient Active Problem List   Diagnosis Date Noted   Tension-type headache, not intractable 12/24/2016   Insomnia 12/24/2016   Depression 12/24/2016   Diabetes mellitus screening 11/10/2016    PCP: No PCP in chart  REFERRING PROVIDER: Jule Ronal CROME, PA-C  REFERRING DIAG: [phone] (ICD-10-CM) - Chronic right-sided low back pain with right-sided sciatica  Rationale for Evaluation and Treatment: Rehabilitation  THERAPY DIAG:  Other low back pain  ONSET DATE: 5-6 months  SUBJECTIVE:                                                                                                                                                                                           SUBJECTIVE STATEMENT: Patient reports that her pain is somewhat better today following yesterday's session. She even was able to walk for about 30 minutes last night without pain.   EVAL: Accompanied by  daughter per pt request. Appreciate assistance of video interpreter although there are some initial difficulties getting set up. Pt reports onset of pain 5-6 months ago without MOI or clear change in activity. 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 States symptoms have improved somewhat since onset but remain severe. Has been to ED a few times and states she was told she has sciatica. Describes R back +  LE symptoms that go from hip, posterolateral thigh into calf/heel.  No L sided symptoms. Describes pain as electric shock at times but doesn't endorse clear N/T. Denies any saddle anesthesia or sensory changes. Denies any issues w/ bowel/bladder, although she does endorse some abdominal pain which she states she has reached out to provider about.   PERTINENT HISTORY:  hx TB, insomnia, depression, headache   PAIN:  Are you having pain: 8/10, variable Location/description: R sided low back into thigh, posterior calf/heel; throbbing - aggravating factors: driving, sitting >89fpw, sleeping - Easing factors: movement changes, walking, lying down, heating pad   PRECAUTIONS: None  RED FLAGS: none   WEIGHT BEARING RESTRICTIONS: No  FALLS:  Has patient fallen in last 6 months? 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  DIAGNOSTIC FINDINGS:  03/11/24 Lumbar XR: X-rays demonstrate slight rotational deformity of the lumbar spine.   Otherwise, no acute or structural abnormalities.   PATIENT SURVEYS:  ODI: 35/50  COGNITION: Overall cognitive status: Within functional limits for tasks assessed     SENSATION: Does not endorse any sensory complaints    POSTURE: lateral shift towards L,  posterior lean   LUMBAR ROM:   AROM eval  Flexion Knee, *  Extension 75%   Right lateral flexion   Left lateral flexion   Right rotation 75% *  Left rotation 75% *    (Blank rows = not tested) (Key: WFL = within functional limits not formally assessed, * = concordant pain, s = stiffness/stretching sensation, NT = not tested) Comment: second attempt at flexion with mild improvement after repeated ext x5  LOWER EXTREMITY MMT:    MMT Right eval Left eval  Hip flexion 4- 4  Hip abduction (modified sitting)    Hip internal rotation    Hip external rotation    Knee flexion 4- * 4+  Knee extension 4- * 4+  Ankle dorsiflexion 4+ 4+   (Blank rows = not tested) (Key: WFL = within functional limits not formally assessed, * = concordant pain, s = stiffness/stretching sensation, NT = not tested)  Comments:    LUMBAR SPECIAL TESTS:  Mild improvement in flexion tolerance following repeated ext  04/06/24: Slump test: Positive BIL (Pain on right when performed on Left)  FUNCTIONAL TESTS:  5xSTS: 34.14sec UE support from thighs  GAIT: Distance walked: within clinic Assistive device utilized: None Level of assistance: Complete Independence Comments: mildly reduced stance time RLE, reduced truncal rotation and arm swing  TREATMENT DATE:   [facility] Adult PT Treatment:                                                DATE: 04/07/24  Neuromuscular re-ed: Seated sciatic glide (full) x 10, difficulty d/t pain  Seated thoracic extension in chair with 1/2 foam 2x10 Supine pelvic tilt with dynadisc, x 10 anterior, x 5 posterior, limited by pain  Seated Paloff press  x 15 each side.   Therapeutic Activity: Log rolling technique for supine<->sit, reviewed x 2    [facility] Adult PT Treatment:  DATE: 04/06/24 Neuromuscular re-ed: Seated sciatic nerve flossing with ankle DF/PF with heel supported on floor x10 (verbal instruction for HEP) Seated thoracic  extension in chair 2x10 Supine PPT 5 hold 2x10 Therapeutic Activity: Slump test performed BIL, results above Log rolling technique for supine<->sit Self Care/Home Management: Pain education and management strategies, importance of completing HEP, performing HEP daily (up to 2x a day if feeling ok)                                                                                                  PATIENT EDUCATION:  Education details: Pt education on PT impairments, prognosis, and POC. Informed consent. Rationale for interventions, safe/appropriate HEP performance Person educated: Patient Education method: Explanation, Demonstration, Tactile cues, Verbal cues Education comprehension: verbalized understanding, returned demonstration, verbal cues required, tactile cues required, and needs further education    HOME EXERCISE PROGRAM: Access Code: [email or web address] URL: [email or web address] Date: 04/06/2024 Prepared by: Corean Pouch  Exercises - Seated Slump Nerve Glide  - 1 x daily - 7 x weekly - 2 sets - 10 reps (instructed in way performed in the clinic with heel supported and moving through PF/DF in tolerable range) - Supine Posterior Pelvic Tilt  - 1 x daily - 7 x weekly - 2 sets - 10 reps  ASSESSMENT:  CLINICAL IMPRESSION: 04/07/2024 Alyzabeth had good tolerance of today's treatment session, which focused on thoracolumbar and neural mobility. We returning to knee flexion during sciatic nerve glide. She states that it feels like her nerve is stretching and then it relaxes and feels better. Reports decreased pain following nerve glides from 10/10 to 7/10 NPRS.  We will continue to progress per POC as tolerated, in order to reach established rehab goals.    EVAL: Patient is a pleasant 38 y.o. woman who was seen today for physical therapy evaluation and treatment for back pain ongoing over past few months. Increased time is required for exam/subjective given initial difficulties  w/ video interpreting services. 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 Does not endorse any red flags. On exam she demonstrates good ROM of lumbar spine overall, although it is painful particularly into flexion. Flexion tolerance is modestly improved after repeated extension. LE strength testing is limited by pain. 5xSTS indicative of reduced mobility and fall risk. No adverse events, tolerates session well overall but does have fairly consistent pain levels throughout. Recommend trial of skilled PT to address aforementioned deficits with aim of improving functional tolerance and reducing pain with typical activities. Pt departs today's session in no acute distress, all voiced concerns/questions addressed appropriately from PT perspective.    OBJECTIVE IMPAIRMENTS: decreased activity tolerance, decreased endurance, decreased mobility, difficulty walking, decreased ROM, decreased strength, impaired perceived functional ability, improper body mechanics, postural dysfunction, and pain.   ACTIVITY LIMITATIONS: carrying, lifting, bending, sitting, standing, squatting, sleeping, stairs, transfers, locomotion level, and caring for others  PARTICIPATION LIMITATIONS: meal prep, cleaning, laundry, community activity, and occupation  PERSONAL FACTORS: Time since onset of injury/illness/exacerbation and 3+ comorbidities: hx TB, insomnia, depression, headache  are also affecting patient's functional outcome.   REHAB POTENTIAL: Good  CLINICAL DECISION MAKING: Evolving/moderate complexity  EVALUATION COMPLEXITY: Moderate   GOALS:   SHORT TERM GOALS: Target date: 04/20/2024  Pt will demonstrate appropriate understanding and performance of initially prescribed HEP in order to facilitate improved independence with management of symptoms.  Baseline: HEP TBD  Goal status: INITIAL   2. Pt will report at least 25%  improvement in overall pain levels over past week in order to facilitate improved tolerance to typical daily activities.   Baseline: 8/10 on eval  Goal status: INITIAL    LONG TERM GOALS: Target date: 05/11/2024   Pt will improve at least 20% on ODI in order to demonstrate improved perception of functional status due to symptoms.  Baseline: 32/50, 64% Goal status: INITIAL  2.  Pt will demonstrate lumbar flexion AROM to at least distal shin with less than 3 pt increase in pain in order to demonstrate improved tolerance to functional movement patterns.   Baseline: see ROM chart above Goal status: INITIAL  3.  Pt will demonstrate bilateral hip/knee MMT of at least 4+/5 in order to demonstrate improved strength for functional movements.  Baseline: see MMT chart above Goal status: INITIAL  4. Pt will perform 5xSTS in <18 sec in order to demonstrate reduced fall risk and improved functional independence. (MCID of 2.3sec)  Baseline: 34sec UE support  Goal status: INITIAL   5. Pt will be able to tolerate sitting for >45 min in order to improve driving tolerance.  Baseline: pain >33min  Goal status: INITIAL  PLAN:  PT FREQUENCY: 2x/week  PT DURATION: 6 weeks  PLANNED INTERVENTIONS: 97164- PT Re-evaluation, 97750- Physical Performance Testing, 97110-Therapeutic exercises, 97530- Therapeutic activity, 97112- Neuromuscular re-education, 97535- Self Care, 02859- Manual therapy, [phone]- Gait training, [phone]- Aquatic Therapy, [phone] (1-2 muscles), 20561 (3+ muscles)- Dry Needling, Patient/Family education, Balance training, Stair training, Taping, Joint mobilization, Joint manipulation, Spinal mobilization, Cryotherapy, and Moist heat.  PLAN FOR NEXT SESSION: establish HEP. Responds well to repeated extension on eval. Consider looking at slump test and sciatic nerve glides within pt tolerance. LE/core stability training. Symptom modification strategies as indicated/appropriate.    Marko Molt  PT  04/07/2024 9:54 AM   For all possible CPT codes, reference the Planned Interventions line above.     Check all conditions that are expected to impact treatment: {Conditions expected to impact treatment:Musculoskeletal disorders

## 2024-04-12 ENCOUNTER — Ambulatory Visit

## 2024-04-12 DIAGNOSIS — M5459 Other low back pain: Secondary | ICD-10-CM | POA: Diagnosis not present

## 2024-04-12 NOTE — Therapy (Signed)
 OUTPATIENT PHYSICAL THERAPY TREATMENT NOTE   Patient Name: Hannah [name] MRN: [id number] DOB:13-Aug-1986, 38 y.o., female Today's Date: 04/12/2024  END OF SESSION:  PT End of Session - 04/12/24 1449     Visit Number 4    Number of Visits 13    Date for PT Re-Evaluation 05/11/24    Authorization Type [employer]    Authorization Time Period 9 visits approved 03/30/24-05/28/24    Authorization - Visit Number 3    Authorization - Number of Visits 9    PT Start Time 1447    PT Stop Time 1528    PT Time Calculation (min) 41 min    Activity Tolerance Patient tolerated treatment well    Behavior During Therapy WFL for tasks assessed/performed             Past Medical History:  Diagnosis Date   Infection    UTI in early preg   Tuberculosis    Past Surgical History:  Procedure Laterality Date   NO PAST SURGERIES     TUBAL LIGATION Bilateral 08/19/2014   Procedure: POST PARTUM TUBAL LIGATION;  Surgeon: Lang JINNY Peel, DO;  Location: [facility];  Service: Gynecology;  Laterality: Bilateral;   Patient Active Problem List   Diagnosis Date Noted   Tension-type headache, not intractable 12/24/2016   Insomnia 12/24/2016   Depression 12/24/2016   Diabetes mellitus screening 11/10/2016    PCP: No PCP in chart  REFERRING PROVIDER: Jule Ronal CROME, PA-C  REFERRING DIAG: [phone] (ICD-10-CM) - Chronic right-sided low back pain with right-sided sciatica  Rationale for Evaluation and Treatment: Rehabilitation  THERAPY DIAG:  Other low back pain  ONSET DATE: 5-6 months  SUBJECTIVE:                                                                                                                                                                                           SUBJECTIVE STATEMENT: 04/12/2024 Patient reports that her back feels much better, but she continues to have R ankle pain that is throbbing especially at night.    EVAL: Accompanied by daughter per pt  request. Appreciate assistance of video interpreter although there are some initial difficulties getting set up. Pt reports onset of pain 5-6 months ago without MOI or clear change in activity. 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 States symptoms have improved somewhat since onset but remain severe. Has been to ED a few times and states she was told she has sciatica. Describes R back + LE  symptoms that go from hip, posterolateral thigh into calf/heel.  No L sided symptoms. Describes pain as electric shock at times but doesn't endorse clear N/T. Denies any saddle anesthesia or sensory changes. Denies any issues w/ bowel/bladder, although she does endorse some abdominal pain which she states she has reached out to provider about.   PERTINENT HISTORY:  hx TB, insomnia, depression, headache   PAIN:  Are you having pain: 8/10, variable Location/description: R sided low back into thigh, posterior calf/heel; throbbing - aggravating factors: driving, sitting >89fpw, sleeping - Easing factors: movement changes, walking, lying down, heating pad   PRECAUTIONS: None  RED FLAGS: none   WEIGHT BEARING RESTRICTIONS: No  FALLS:  Has patient fallen in last 6 months? 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  DIAGNOSTIC FINDINGS:  03/11/24 Lumbar XR: X-rays demonstrate slight rotational deformity of the lumbar spine.   Otherwise, no acute or structural abnormalities.   PATIENT SURVEYS:  ODI: 35/50  COGNITION: Overall cognitive status: Within functional limits for tasks assessed     SENSATION: Does not endorse any sensory complaints    POSTURE: lateral shift towards L, posterior  lean   LUMBAR ROM:   AROM eval  Flexion Knee, *  Extension 75%   Right lateral flexion   Left lateral flexion   Right rotation 75% *  Left rotation 75% *    (Blank rows = not tested) (Key: WFL = within functional limits not formally assessed, * = concordant pain, s = stiffness/stretching sensation, NT = not tested) Comment: second attempt at flexion with mild improvement after repeated ext x5  LOWER EXTREMITY MMT:    MMT Right eval Left eval  Hip flexion 4- 4  Hip abduction (modified sitting)    Hip internal rotation    Hip external rotation    Knee flexion 4- * 4+  Knee extension 4- * 4+  Ankle dorsiflexion 4+ 4+   (Blank rows = not tested) (Key: WFL = within functional limits not formally assessed, * = concordant pain, s = stiffness/stretching sensation, NT = not tested)  Comments:    LUMBAR SPECIAL TESTS:  Mild improvement in flexion tolerance following repeated ext  04/06/24: Slump test: Positive BIL (Pain on right when performed on Left)  FUNCTIONAL TESTS:  5xSTS: 34.14sec UE support from thighs  GAIT: Distance walked: within clinic Assistive device utilized: None Level of assistance: Complete Independence Comments: mildly reduced stance time RLE, reduced truncal rotation and arm swing  TREATMENT DATE:   [facility] Adult PT Treatment:                                                DATE: 04/12/2024   Neuromuscular re-ed: Seated sciatic glide (full) 2 x 5n  Supine pelvic tilt with dynadisc, anterior-to-posterior, 2 x 10  pain when resting after each set Consider discontinuing at next visit  Supine SLR 2 x 10 L side only, unable to tolerate R side  Seated Paloff press  2x 15 each side.  Seated march x 15, with RTB x 15  Seated hip abduction x 15, with RTB x 15     [facility] Adult PT Treatment:  DATE: 04/07/24  Neuromuscular re-ed: Seated sciatic glide (full) x 10, difficulty d/t pain  Seated thoracic extension in  chair with 1/2 foam 2x10 Supine pelvic tilt with dynadisc, x 10 anterior, x 5 posterior, limited by pain  Seated Paloff press  x 15 each side.   Therapeutic Activity: Log rolling technique for supine<->sit, reviewed x 2    [facility] Adult PT Treatment:                                                DATE: 04/06/24 Neuromuscular re-ed: Seated sciatic nerve flossing with ankle DF/PF with heel supported on floor x10 (verbal instruction for HEP) Seated thoracic extension in chair 2x10 Supine PPT 5 hold 2x10 Therapeutic Activity: Slump test performed BIL, results above Log rolling technique for supine<->sit Self Care/Home Management: Pain education and management strategies, importance of completing HEP, performing HEP daily (up to 2x a day if feeling ok)                                                                                                  PATIENT EDUCATION:  Education details: Pt education on PT impairments, prognosis, and POC. Informed consent. Rationale for interventions, safe/appropriate HEP performance Person educated: Patient Education method: Explanation, Demonstration, Tactile cues, Verbal cues Education comprehension: verbalized understanding, returned demonstration, verbal cues required, tactile cues required, and needs further education    HOME EXERCISE PROGRAM: Access Code: [email or web address] URL: [email or web address] Date: 04/06/2024 Prepared by: Corean Pouch  Exercises - Seated Slump Nerve Glide  - 1 x daily - 7 x weekly - 2 sets - 10 reps (instructed in way performed in the clinic with heel supported and moving through PF/DF in tolerable range) - Supine Posterior Pelvic Tilt  - 1 x daily - 7 x weekly - 2 sets - 10 reps  ASSESSMENT:  CLINICAL IMPRESSION: 04/12/2024 Bartholome had good tolerance of today's treatment session, which focused on progression of LQ strength and core stability program. She responded well to changes today, reporting decreased LE  pain severity. We will continue to progress per POC as tolerated, in order to reach established rehab goals.     EVAL: Patient is a pleasant 38 y.o. woman who was seen today for physical therapy evaluation and treatment for back pain ongoing over past few months. Increased time is required for exam/subjective given initial difficulties w/ video interpreting services. 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 Does not endorse any red flags. On exam she demonstrates good ROM of lumbar spine overall, although it is painful particularly into flexion. Flexion tolerance is modestly improved after repeated extension. LE strength testing is limited by pain. 5xSTS indicative of reduced mobility and fall risk. No adverse events, tolerates session well overall but does have fairly consistent pain levels throughout. Recommend trial of skilled PT to address aforementioned deficits with aim of improving functional tolerance and reducing pain with typical activities.  Pt departs today's session in no acute distress, all voiced concerns/questions addressed appropriately from PT perspective.    OBJECTIVE IMPAIRMENTS: decreased activity tolerance, decreased endurance, decreased mobility, difficulty walking, decreased ROM, decreased strength, impaired perceived functional ability, improper body mechanics, postural dysfunction, and pain.   ACTIVITY LIMITATIONS: carrying, lifting, bending, sitting, standing, squatting, sleeping, stairs, transfers, locomotion level, and caring for others  PARTICIPATION LIMITATIONS: meal prep, cleaning, laundry, community activity, and occupation  PERSONAL FACTORS: Time since onset of injury/illness/exacerbation and 3+ comorbidities: hx TB, insomnia, depression, headache are also affecting patient's functional outcome.   REHAB POTENTIAL: Good  CLINICAL DECISION MAKING: Evolving/moderate  complexity  EVALUATION COMPLEXITY: Moderate   GOALS:   SHORT TERM GOALS: Target date: 04/20/2024  Pt will demonstrate appropriate understanding and performance of initially prescribed HEP in order to facilitate improved independence with management of symptoms.  Baseline: HEP TBD  Goal status: INITIAL   2. Pt will report at least 25% improvement in overall pain levels over past week in order to facilitate improved tolerance to typical daily activities.   Baseline: 8/10 on eval  Goal status: INITIAL    LONG TERM GOALS: Target date: 05/11/2024   Pt will improve at least 20% on ODI in order to demonstrate improved perception of functional status due to symptoms.  Baseline: 32/50, 64% Goal status: INITIAL  2.  Pt will demonstrate lumbar flexion AROM to at least distal shin with less than 3 pt increase in pain in order to demonstrate improved tolerance to functional movement patterns.   Baseline: see ROM chart above Goal status: INITIAL  3.  Pt will demonstrate bilateral hip/knee MMT of at least 4+/5 in order to demonstrate improved strength for functional movements.  Baseline: see MMT chart above Goal status: INITIAL  4. Pt will perform 5xSTS in <18 sec in order to demonstrate reduced fall risk and improved functional independence. (MCID of 2.3sec)  Baseline: 34sec UE support  Goal status: INITIAL   5. Pt will be able to tolerate sitting for >45 min in order to improve driving tolerance.  Baseline: pain >92min  Goal status: INITIAL  PLAN:  PT FREQUENCY: 2x/week  PT DURATION: 6 weeks  PLANNED INTERVENTIONS: 97164- PT Re-evaluation, 97750- Physical Performance Testing, 97110-Therapeutic exercises, 97530- Therapeutic activity, 97112- Neuromuscular re-education, 97535- Self Care, 02859- Manual therapy, [phone]- Gait training, [phone]- Aquatic Therapy, [phone] (1-2 muscles), 20561 (3+ muscles)- Dry Needling, Patient/Family education, Balance training, Stair training, Taping, Joint  mobilization, Joint manipulation, Spinal mobilization, Cryotherapy, and Moist heat.  PLAN FOR NEXT SESSION: establish HEP. Responds well to repeated extension on eval. Consider looking at slump test and sciatic nerve glides within pt tolerance. LE/core stability training. Symptom modification strategies as indicated/appropriate.    Marko Molt PT  04/12/2024 6:33 PM   For all possible CPT codes, reference the Planned Interventions line above.     Check all conditions that are expected to impact treatment: {Conditions expected to impact treatment:Musculoskeletal disorders

## 2024-04-14 ENCOUNTER — Ambulatory Visit: Admitting: Physical Therapy

## 2024-04-14 NOTE — Therapy (Incomplete)
 OUTPATIENT PHYSICAL THERAPY TREATMENT NOTE   Patient Name: Hannah [name] MRN: [id number] DOB:[birth year], 38 y.o., female Today's Date: 04/14/2024  END OF SESSION:       Past Medical History:  Diagnosis Date   Infection    UTI in early preg   Tuberculosis    Past Surgical History:  Procedure Laterality Date   NO PAST SURGERIES     TUBAL LIGATION Bilateral 08/19/2014   Procedure: POST PARTUM TUBAL LIGATION;  Surgeon: Lang JINNY Peel, DO;  Location: [facility];  Service: Gynecology;  Laterality: Bilateral;   Patient Active Problem List   Diagnosis Date Noted   Tension-type headache, not intractable 12/24/2016   Insomnia 12/24/2016   Depression 12/24/2016   Diabetes mellitus screening 11/10/2016    PCP: No PCP in chart  REFERRING PROVIDER: Jule Ronal CROME, PA-C  REFERRING DIAG: [phone] (ICD-10-CM) - Chronic right-sided low back pain with right-sided sciatica  Rationale for Evaluation and Treatment: Rehabilitation  THERAPY DIAG:  No diagnosis found.  ONSET DATE: 5-6 months  SUBJECTIVE:                                                                                                                                                                                           SUBJECTIVE STATEMENT: 04/14/2024: ***  *** Patient reports that her back feels much better, but she continues to have R ankle pain that is throbbing especially at night.    EVAL: Accompanied by daughter per pt request. Appreciate assistance of video interpreter although there are some initial difficulties getting set up. Pt reports onset of pain 5-6 months ago without MOI or clear change in activity. 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 States symptoms have improved somewhat since onset but remain severe. Has been to ED a few times and states she was told she has sciatica. Describes R back  + LE symptoms that go from hip, posterolateral thigh into calf/heel.  No L sided symptoms. Describes pain as electric shock at times but doesn't endorse clear N/T. Denies any saddle anesthesia or sensory changes. Denies any issues w/ bowel/bladder, although she does endorse some abdominal pain which she states she has reached out to provider about.   PERTINENT HISTORY:  hx TB, insomnia, depression, headache   PAIN:  Are you having pain: 8/10, variable *** Location/description: R sided low back into thigh, posterior calf/heel; throbbing - aggravating factors: driving, sitting >89fpw, sleeping - Easing factors: movement changes, walking, lying down, heating pad   PRECAUTIONS: None  RED FLAGS: none  WEIGHT BEARING RESTRICTIONS: No  FALLS:  Has patient fallen in last 6 months? 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  DIAGNOSTIC FINDINGS:  03/11/24 Lumbar XR: X-rays demonstrate slight rotational deformity of the lumbar spine.   Otherwise, no acute or structural abnormalities.   PATIENT SURVEYS:  ODI: 35/50  COGNITION: Overall cognitive status: Within functional limits for tasks assessed     SENSATION: Does not endorse any sensory complaints    POSTURE: lateral shift towards L, posterior lean   LUMBAR ROM:   AROM eval  Flexion Knee, *  Extension 75%   Right lateral flexion   Left lateral flexion   Right rotation 75% *  Left rotation 75% *    (Blank rows = not tested) (Key: WFL = within functional limits not formally assessed, * = concordant pain, s = stiffness/stretching sensation, NT = not tested) Comment: second attempt at flexion with mild improvement after repeated ext x5  LOWER EXTREMITY MMT:    MMT Right eval Left eval  Hip flexion 4- 4  Hip  abduction (modified sitting)    Hip internal rotation    Hip external rotation    Knee flexion 4- * 4+  Knee extension 4- * 4+  Ankle dorsiflexion 4+ 4+   (Blank rows = not tested) (Key: WFL = within functional limits not formally assessed, * = concordant pain, s = stiffness/stretching sensation, NT = not tested)  Comments:    LUMBAR SPECIAL TESTS:  Mild improvement in flexion tolerance following repeated ext  04/06/24: Slump test: Positive BIL (Pain on right when performed on Left)  FUNCTIONAL TESTS:  5xSTS: 34.14sec UE support from thighs  GAIT: Distance walked: within clinic Assistive device utilized: None Level of assistance: Complete Independence Comments: mildly reduced stance time RLE, reduced truncal rotation and arm swing  TREATMENT DATE:   [facility] Adult PT Treatment:                                                DATE: 04/14/24 Therapeutic Exercise: *** Manual Therapy: *** Neuromuscular re-ed: *** Therapeutic Activity: *** Modalities: *** Self Care: ***    [facility] Adult PT Treatment:                                                DATE: 04/12/2024   Neuromuscular re-ed: Seated sciatic glide (full) 2 x 5n  Supine pelvic tilt with dynadisc, anterior-to-posterior, 2 x 10  pain when resting after each set Consider discontinuing at next visit  Supine SLR 2 x 10 L side only, unable to tolerate R side  Seated Paloff press  2x 15 each side.  Seated march x 15, with RTB x 15  Seated hip abduction x 15, with RTB x 15     [facility] Adult PT Treatment:  DATE: 04/07/24  Neuromuscular re-ed: Seated sciatic glide (full) x 10, difficulty d/t pain  Seated thoracic extension in chair with 1/2 foam 2x10 Supine pelvic tilt with dynadisc, x 10 anterior, x 5 posterior, limited by pain  Seated Paloff press  x 15 each side.   Therapeutic Activity: Log rolling technique for supine<->sit, reviewed x 2    [facility] Adult PT Treatment:                                                 DATE: 04/06/24 Neuromuscular re-ed: Seated sciatic nerve flossing with ankle DF/PF with heel supported on floor x10 (verbal instruction for HEP) Seated thoracic extension in chair 2x10 Supine PPT 5 hold 2x10 Therapeutic Activity: Slump test performed BIL, results above Log rolling technique for supine<->sit Self Care/Home Management: Pain education and management strategies, importance of completing HEP, performing HEP daily (up to 2x a day if feeling ok)                                                                                                  PATIENT EDUCATION:  Education details: rationale for interventions, HEP  Person educated: Patient Education method: Explanation, Demonstration, Tactile cues, Verbal cues Education comprehension: verbalized understanding, returned demonstration, verbal cues required, tactile cues required, and needs further education     HOME EXERCISE PROGRAM: Access Code: [email or web address] URL: [email or web address] Date: 04/06/2024 Prepared by: Corean Pouch  Exercises - Seated Slump Nerve Glide  - 1 x daily - 7 x weekly - 2 sets - 10 reps (instructed in way performed in the clinic with heel supported and moving through PF/DF in tolerable range) - Supine Posterior Pelvic Tilt  - 1 x daily - 7 x weekly - 2 sets - 10 reps  ASSESSMENT:  CLINICAL IMPRESSION: 04/14/2024: ***  *** Aviela had good tolerance of today's treatment session, which focused on progression of LQ strength and core stability program. She responded well to changes today, reporting decreased LE pain severity. We will continue to progress per POC as tolerated, in order to reach established rehab goals.     EVAL: Patient is a pleasant 38 y.o. woman who was seen today for physical therapy evaluation and treatment for back pain ongoing over past few months. Increased time is required for exam/subjective given initial difficulties w/  video interpreting services. 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 Does not endorse any red flags. On exam she demonstrates good ROM of lumbar spine overall, although it is painful particularly into flexion. Flexion tolerance is modestly improved after repeated extension. LE strength testing is limited by pain. 5xSTS indicative of reduced mobility and fall risk. No adverse events, tolerates session well overall but does have fairly consistent pain levels throughout. Recommend trial of skilled PT to address aforementioned deficits with aim of improving functional tolerance and reducing pain with typical activities. Pt departs today's session in no acute  distress, all voiced concerns/questions addressed appropriately from PT perspective.    OBJECTIVE IMPAIRMENTS: decreased activity tolerance, decreased endurance, decreased mobility, difficulty walking, decreased ROM, decreased strength, impaired perceived functional ability, improper body mechanics, postural dysfunction, and pain.   ACTIVITY LIMITATIONS: carrying, lifting, bending, sitting, standing, squatting, sleeping, stairs, transfers, locomotion level, and caring for others  PARTICIPATION LIMITATIONS: meal prep, cleaning, laundry, community activity, and occupation  PERSONAL FACTORS: Time since onset of injury/illness/exacerbation and 3+ comorbidities: hx TB, insomnia, depression, headache are also affecting patient's functional outcome.   REHAB POTENTIAL: Good  CLINICAL DECISION MAKING: Evolving/moderate complexity  EVALUATION COMPLEXITY: Moderate   GOALS:   SHORT TERM GOALS: Target date: 04/20/2024  Pt will demonstrate appropriate understanding and performance of initially prescribed HEP in order to facilitate improved independence with management of symptoms.  Baseline: HEP TBD  Goal status: INITIAL   2. Pt will report at least 25%  improvement in overall pain levels over past week in order to facilitate improved tolerance to typical daily activities.   Baseline: 8/10 on eval  Goal status: INITIAL    LONG TERM GOALS: Target date: 05/11/2024   Pt will improve at least 20% on ODI in order to demonstrate improved perception of functional status due to symptoms.  Baseline: 32/50, 64% Goal status: INITIAL  2.  Pt will demonstrate lumbar flexion AROM to at least distal shin with less than 3 pt increase in pain in order to demonstrate improved tolerance to functional movement patterns.   Baseline: see ROM chart above Goal status: INITIAL  3.  Pt will demonstrate bilateral hip/knee MMT of at least 4+/5 in order to demonstrate improved strength for functional movements.  Baseline: see MMT chart above Goal status: INITIAL  4. Pt will perform 5xSTS in <18 sec in order to demonstrate reduced fall risk and improved functional independence. (MCID of 2.3sec)  Baseline: 34sec UE support  Goal status: INITIAL   5. Pt will be able to tolerate sitting for >45 min in order to improve driving tolerance.  Baseline: pain >14min  Goal status: INITIAL  PLAN:  PT FREQUENCY: 2x/week  PT DURATION: 6 weeks  PLANNED INTERVENTIONS: 97164- PT Re-evaluation, 97750- Physical Performance Testing, 97110-Therapeutic exercises, 97530- Therapeutic activity, 97112- Neuromuscular re-education, 97535- Self Care, [id number]- Manual therapy, [phone]- Gait training, [phone]- Aquatic Therapy, [phone] (1-2 muscles), [id number] (3+ muscles)- Dry Needling, Patient/Family education, Balance training, Stair training, Taping, Joint mobilization, Joint manipulation, Spinal mobilization, Cryotherapy, and Moist heat.  PLAN FOR NEXT SESSION: establish HEP. Responds well to repeated extension on eval. Consider looking at slump test and sciatic nerve glides within pt tolerance. LE/core stability training. Symptom modification strategies as indicated/appropriate.    Alm DELENA Jenny  PT, DPT 04/14/2024 7:50 AM

## 2024-04-18 NOTE — Therapy (Signed)
 OUTPATIENT PHYSICAL THERAPY TREATMENT NOTE   Patient Name: Hannah [name] MRN: [id number] DOB:Mar 03, 1986, 38 y.o., female Today's Date: 04/19/2024  END OF SESSION:  PT End of Session - 04/19/24 1532     Visit Number 5    Number of Visits 13    Date for PT Re-Evaluation 05/11/24    Authorization Type [employer]    Authorization Time Period 9 visits approved 03/30/24-05/28/24    Authorization - Visit Number 4    Authorization - Number of Visits 9    PT Start Time 1532    PT Stop Time 1610    PT Time Calculation (min) 38 min              Past Medical History:  Diagnosis Date   Infection    UTI in early preg   Tuberculosis    Past Surgical History:  Procedure Laterality Date   NO PAST SURGERIES     TUBAL LIGATION Bilateral 08/19/2014   Procedure: POST PARTUM TUBAL LIGATION;  Surgeon: Lang JINNY Peel, DO;  Location: [facility];  Service: Gynecology;  Laterality: Bilateral;   Patient Active Problem List   Diagnosis Date Noted   Tension-type headache, not intractable 12/24/2016   Insomnia 12/24/2016   Depression 12/24/2016   Diabetes mellitus screening 11/10/2016    PCP: No PCP in chart  REFERRING PROVIDER: Jule Ronal CROME, PA-C  REFERRING DIAG: [phone] (ICD-10-CM) - Chronic right-sided low back pain with right-sided sciatica  Rationale for Evaluation and Treatment: Rehabilitation  THERAPY DIAG:  Other low back pain  ONSET DATE: 5-6 months  SUBJECTIVE:                                                                                                                                                                                           SUBJECTIVE STATEMENT: 04/19/2024: appreciate assistance of in person interpreter. Feeling a bit better overall but still reports that evening pain in RLE persists. Back pain much better, 6/10 at present. HEP helps some, felt good after last session.   EVAL: Accompanied by daughter per pt request. Appreciate  assistance of video interpreter although there are some initial difficulties getting set up. Pt reports onset of pain 5-6 months ago without MOI or clear change in activity. 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 States symptoms have improved somewhat since onset but remain severe. Has been to ED a few times and states she was told she has sciatica. Describes R back + LE symptoms that go from hip, posterolateral  thigh into calf/heel.  No L sided symptoms. Describes pain as electric shock at times but doesn't endorse clear N/T. Denies any saddle anesthesia or sensory changes. Denies any issues w/ bowel/bladder, although she does endorse some abdominal pain which she states she has reached out to provider about.   PERTINENT HISTORY:  hx TB, insomnia, depression, headache   PAIN:  Are you having pain: 7/10 back, 5/10 RLE (posterior thigh only)  Per eval:  Location/description: R sided low back into thigh, posterior calf/heel; throbbing - aggravating factors: driving, sitting >89fpw, sleeping - Easing factors: movement changes, walking, lying down, heating pad   PRECAUTIONS: None  RED FLAGS: none   WEIGHT BEARING RESTRICTIONS: No  FALLS:  Has patient fallen in last 6 months? 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  DIAGNOSTIC FINDINGS:  03/11/24 Lumbar XR: X-rays demonstrate slight rotational deformity of the lumbar spine.   Otherwise, no acute or structural abnormalities.   PATIENT SURVEYS:  ODI: 35/50  COGNITION: Overall cognitive status: Within functional limits for tasks assessed     SENSATION: Does not endorse any sensory complaints    POSTURE: lateral shift  towards L, posterior lean   LUMBAR ROM:   AROM eval  Flexion Knee, *  Extension 75%   Right lateral flexion   Left lateral flexion   Right rotation 75% *  Left rotation 75% *    (Blank rows = not tested) (Key: WFL = within functional limits not formally assessed, * = concordant pain, s = stiffness/stretching sensation, NT = not tested) Comment: second attempt at flexion with mild improvement after repeated ext x5  LOWER EXTREMITY MMT:    MMT Right eval Left eval  Hip flexion 4- 4  Hip abduction (modified sitting)    Hip internal rotation    Hip external rotation    Knee flexion 4- * 4+  Knee extension 4- * 4+  Ankle dorsiflexion 4+ 4+   (Blank rows = not tested) (Key: WFL = within functional limits not formally assessed, * = concordant pain, s = stiffness/stretching sensation, NT = not tested)  Comments:    LUMBAR SPECIAL TESTS:  Mild improvement in flexion tolerance following repeated ext  04/06/24: Slump test: Positive BIL (Pain on right when performed on Left)  FUNCTIONAL TESTS:  5xSTS: 34.14sec UE support from thighs  GAIT: Distance walked: within clinic Assistive device utilized: None Level of assistance: Complete Independence Comments: mildly reduced stance time RLE, reduced truncal rotation and arm swing  TREATMENT DATE:   [facility] PT Treatment:                                                DATE: 04/19/24 Therapeutic Exercise: DTKC w/ pball 2x10 cues for gentle rocking and comfortable ROM Standing red band hip abd 3x8 BIL cues for posture  Standing triple flex/ext within comfortable ROM 2x10 BIL   Neuromuscular re-ed: Hooklying pelvic tilt x10 cues for motor control PPT hold x30sec cues for breath control Hooklying pelvic tilt + slow march 2x8 BIL  Sciatic nerve glides, supine on pball 2x8 RLE Standing red band paloff 2x8 cues for posture   [facility] Adult PT Treatment:  DATE: 04/12/2024   Neuromuscular  re-ed: Seated sciatic glide (full) 2 x 5n  Supine pelvic tilt with dynadisc, anterior-to-posterior, 2 x 10  pain when resting after each set Consider discontinuing at next visit  Supine SLR 2 x 10 L side only, unable to tolerate R side  Seated Paloff press  2x 15 each side.  Seated march x 15, with RTB x 15  Seated hip abduction x 15, with RTB x 15     [facility] Adult PT Treatment:                                                DATE: 04/07/24  Neuromuscular re-ed: Seated sciatic glide (full) x 10, difficulty d/t pain  Seated thoracic extension in chair with 1/2 foam 2x10 Supine pelvic tilt with dynadisc, x 10 anterior, x 5 posterior, limited by pain  Seated Paloff press  x 15 each side.   Therapeutic Activity: Log rolling technique for supine<->sit, reviewed x 2    [facility] Adult PT Treatment:                                                DATE: 04/06/24 Neuromuscular re-ed: Seated sciatic nerve flossing with ankle DF/PF with heel supported on floor x10 (verbal instruction for HEP) Seated thoracic extension in chair 2x10 Supine PPT 5 hold 2x10 Therapeutic Activity: Slump test performed BIL, results above Log rolling technique for supine<->sit Self Care/Home Management: Pain education and management strategies, importance of completing HEP, performing HEP daily (up to 2x a day if feeling ok)                                                                                                  PATIENT EDUCATION:  Education details: rationale for interventions, HEP  Person educated: Patient Education method: Explanation, Demonstration, Tactile cues, Verbal cues Education comprehension: verbalized understanding, returned demonstration, verbal cues required, tactile cues required, and needs further education     HOME EXERCISE PROGRAM: Access Code: [email or web address] URL: [email or web address] Date: 04/06/2024 Prepared by: Corean Pouch  Exercises - Seated Slump Nerve Glide  -  1 x daily - 7 x weekly - 2 sets - 10 reps (instructed in way performed in the clinic with heel supported and moving through PF/DF in tolerable range) - Supine Posterior Pelvic Tilt  - 1 x daily - 7 x weekly - 2 sets - 10 reps  ASSESSMENT:  CLINICAL IMPRESSION: 04/19/2024: Pt arrives w/ report of some improvement in pain overall but continues to have RLE pain in evenings, some relief w/ HEP. Today we continue to expand core stability program and comfortable mobility. Best response to [facility] w/ pball. Tendency for muscle guarding and holding breath requiring cues to correct. No adverse events, reports improved back/LE pain on departure. Recommend continuing along current POC in order to address  relevant deficits and improve functional tolerance. Pt departs today's session in no acute distress, all voiced questions/concerns addressed appropriately from PT perspective.     EVAL: Patient is a pleasant 38 y.o. woman who was seen today for physical therapy evaluation and treatment for back pain ongoing over past few months. Increased time is required for exam/subjective given initial difficulties w/ video interpreting services. 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 Does not endorse any red flags. On exam she demonstrates good ROM of lumbar spine overall, although it is painful particularly into flexion. Flexion tolerance is modestly improved after repeated extension. LE strength testing is limited by pain. 5xSTS indicative of reduced mobility and fall risk. No adverse events, tolerates session well overall but does have fairly consistent pain levels throughout. Recommend trial of skilled PT to address aforementioned deficits with aim of improving functional tolerance and reducing pain with typical activities. Pt departs today's session in no acute distress, all voiced concerns/questions addressed appropriately from PT  perspective.    OBJECTIVE IMPAIRMENTS: decreased activity tolerance, decreased endurance, decreased mobility, difficulty walking, decreased ROM, decreased strength, impaired perceived functional ability, improper body mechanics, postural dysfunction, and pain.   ACTIVITY LIMITATIONS: carrying, lifting, bending, sitting, standing, squatting, sleeping, stairs, transfers, locomotion level, and caring for others  PARTICIPATION LIMITATIONS: meal prep, cleaning, laundry, community activity, and occupation  PERSONAL FACTORS: Time since onset of injury/illness/exacerbation and 3+ comorbidities: hx TB, insomnia, depression, headache are also affecting patient's functional outcome.   REHAB POTENTIAL: Good  CLINICAL DECISION MAKING: Evolving/moderate complexity  EVALUATION COMPLEXITY: Moderate   GOALS:   SHORT TERM GOALS: Target date: 04/20/2024  Pt will demonstrate appropriate understanding and performance of initially prescribed HEP in order to facilitate improved independence with management of symptoms.  Baseline: HEP TBD  04/19/24: reports good HEP adherence Goal status: MET  2. Pt will report at least 25% improvement in overall pain levels over past week in order to facilitate improved tolerance to typical daily activities.   Baseline: 8/10 on eval 04/19/24: still 7-8/10 on average although frequency of back pain reportedly improved Goal status: ONGOING  LONG TERM GOALS: Target date: 05/11/2024   Pt will improve at least 20% on ODI in order to demonstrate improved perception of functional status due to symptoms.  Baseline: 32/50, 64% Goal status: INITIAL  2.  Pt will demonstrate lumbar flexion AROM to at least distal shin with less than 3 pt increase in pain in order to demonstrate improved tolerance to functional movement patterns.   Baseline: see ROM chart above Goal status: INITIAL  3.  Pt will demonstrate bilateral hip/knee MMT of at least 4+/5 in order to demonstrate improved  strength for functional movements.  Baseline: see MMT chart above Goal status: INITIAL  4. Pt will perform 5xSTS in <18 sec in order to demonstrate reduced fall risk and improved functional independence. (MCID of 2.3sec)  Baseline: 34sec UE support  Goal status: INITIAL   5. Pt will be able to tolerate sitting for >45 min in order to improve driving tolerance.  Baseline: pain >63min  Goal status: INITIAL  PLAN:  PT FREQUENCY: 2x/week  PT DURATION: 6 weeks  PLANNED INTERVENTIONS: 97164- PT Re-evaluation, 97750- Physical Performance Testing, 97110-Therapeutic exercises, 97530- Therapeutic activity, 97112- Neuromuscular re-education, 97535- Self Care, [id number]- Manual therapy, [phone]- Gait training, [phone]- Aquatic Therapy, [phone] (1-2 muscles), [id number] (3+ muscles)- Dry Needling, Patient/Family education, Balance training, Stair training, Taping, Joint mobilization, Joint  manipulation, Spinal mobilization, Cryotherapy, and Moist heat.  PLAN FOR NEXT SESSION: review/update HEP PRN. LE/core stability training. Symptom modification strategies as indicated/appropriate.    Alm DELENA Jenny PT, DPT 04/19/2024 4:14 PM

## 2024-04-19 ENCOUNTER — Encounter: Payer: Self-pay | Admitting: Physical Therapy

## 2024-04-19 ENCOUNTER — Ambulatory Visit: Attending: Physician Assistant | Admitting: Physical Therapy

## 2024-04-19 DIAGNOSIS — M5459 Other low back pain: Secondary | ICD-10-CM | POA: Insufficient documentation

## 2024-04-21 ENCOUNTER — Ambulatory Visit

## 2024-04-21 DIAGNOSIS — M5459 Other low back pain: Secondary | ICD-10-CM

## 2024-04-21 NOTE — Therapy (Signed)
 OUTPATIENT PHYSICAL THERAPY TREATMENT NOTE   Patient Name: Hannah [name] MRN: [id number] DOB:20-Sep-1985, 38 y.o., female Today's Date: 04/21/2024  END OF SESSION:        Past Medical History:  Diagnosis Date   Infection    UTI in early preg   Tuberculosis    Past Surgical History:  Procedure Laterality Date   NO PAST SURGERIES     TUBAL LIGATION Bilateral 08/19/2014   Procedure: POST PARTUM TUBAL LIGATION;  Surgeon: Lang JINNY Peel, DO;  Location: [facility];  Service: Gynecology;  Laterality: Bilateral;   Patient Active Problem List   Diagnosis Date Noted   Tension-type headache, not intractable 12/24/2016   Insomnia 12/24/2016   Depression 12/24/2016   Diabetes mellitus screening 11/10/2016    PCP: No PCP in chart  REFERRING PROVIDER: Jule Ronal CROME, PA-C  REFERRING DIAG: [phone] (ICD-10-CM) - Chronic right-sided low back pain with right-sided sciatica  Rationale for Evaluation and Treatment: Rehabilitation  THERAPY DIAG:  Other low back pain  ONSET DATE: 5-6 months  SUBJECTIVE:                                                                                                                                                                                           SUBJECTIVE STATEMENT: 04/21/2024: Patient reports that she has more pain in the morning, especially from her hip down to the ankle.    EVAL: Accompanied by daughter per pt request. Appreciate assistance of video interpreter although there are some initial difficulties getting set up. Pt reports onset of pain 5-6 months ago without MOI or clear change in activity. 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 States symptoms have improved somewhat since onset but remain severe. Has been to ED a few times and states she was told she has sciatica. Describes R back + LE symptoms that go from hip,  posterolateral thigh into calf/heel.  No L sided symptoms. Describes pain as electric shock at times but doesn't endorse clear N/T. Denies any saddle anesthesia or sensory changes. Denies any issues w/ bowel/bladder, although she does endorse some abdominal pain which she states she has reached out to provider about.   PERTINENT HISTORY:  hx TB, insomnia, depression, headache   PAIN:  Are you having pain: 7/10 back, 5/10 RLE (posterior thigh only)  Per eval:  Location/description: R sided low back into thigh, posterior calf/heel; throbbing - aggravating factors: driving, sitting >89fpw, sleeping - Easing factors: movement changes, walking, lying down, heating pad   PRECAUTIONS: None  RED FLAGS: none   WEIGHT BEARING RESTRICTIONS: No  FALLS:  Has patient fallen in last 6 months? 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  DIAGNOSTIC FINDINGS:  03/11/24 Lumbar XR: X-rays demonstrate slight rotational deformity of the lumbar spine.   Otherwise, no acute or structural abnormalities.   PATIENT SURVEYS:  ODI: 35/50  COGNITION: Overall cognitive status: Within functional limits for tasks assessed     SENSATION: Does not endorse any sensory complaints    POSTURE: lateral shift towards L, posterior lean   LUMBAR ROM:   AROM eval  Flexion Knee, *  Extension 75%   Right lateral flexion   Left lateral flexion   Right rotation 75% *  Left rotation 75% *    (Blank rows = not tested) (Key: WFL = within functional limits not formally assessed, * = concordant pain, s = stiffness/stretching sensation, NT = not tested) Comment: second attempt at flexion with mild improvement after repeated ext x5  LOWER EXTREMITY MMT:    MMT Right eval Left eval  Hip flexion 4- 4  Hip  abduction (modified sitting)    Hip internal rotation    Hip external rotation    Knee flexion 4- * 4+  Knee extension 4- * 4+  Ankle dorsiflexion 4+ 4+   (Blank rows = not tested) (Key: WFL = within functional limits not formally assessed, * = concordant pain, s = stiffness/stretching sensation, NT = not tested)  Comments:    LUMBAR SPECIAL TESTS:  Mild improvement in flexion tolerance following repeated ext  04/06/24: Slump test: Positive BIL (Pain on right when performed on Left)  FUNCTIONAL TESTS:  5xSTS: 34.14sec UE support from thighs  GAIT: Distance walked: within clinic Assistive device utilized: None Level of assistance: Complete Independence Comments: mildly reduced stance time RLE, reduced truncal rotation and arm swing  TREATMENT DATE:   [facility] Adult PT Treatment:                                                DATE: 04/21/2024  Therapeutic Exercise: DTKC w/ pball 2x10 cues for gentle rocking and comfortable ROM Supine LTR with pball  PPT + Bridge, 3 x 5  Standing red band hip abd 3x8 BIL cues for posture  Standing triple flex/ext within comfortable ROM 2x10 BIL   Neuromuscular re-ed: Hooklying pelvic tilt x10 cues for motor control PPT hold x30sec cues for breath control Standing red band paloff 2x8 cues for posture  [facility] Adult PT Treatment:                                                DATE: 04/19/24 Therapeutic Exercise: DTKC w/ pball 2x10 cues for gentle rocking and comfortable ROM Standing red band hip abd 3x8 BIL cues for posture  Standing triple flex/ext within comfortable ROM 2x10 BIL   Neuromuscular re-ed: Hooklying pelvic tilt x10 cues for motor control PPT hold x30sec cues for breath control Hooklying pelvic tilt + slow march 2x8 BIL  Sciatic nerve glides, supine on pball 2x8 RLE Standing red  band paloff 2x8 cues for posture   [facility] Adult PT Treatment:                                                DATE: 04/12/2024   Neuromuscular re-ed: Seated  sciatic glide (full) 2 x 5n  Supine pelvic tilt with dynadisc, anterior-to-posterior, 2 x 10  pain when resting after each set Consider discontinuing at next visit  Supine SLR 2 x 10 L side only, unable to tolerate R side  Seated Paloff press  2x 15 each side.  Seated march x 15, with RTB x 15  Seated hip abduction x 15, with RTB x 15     [facility] Adult PT Treatment:                                                DATE: 04/07/24  Neuromuscular re-ed: Seated sciatic glide (full) x 10, difficulty d/t pain  Seated thoracic extension in chair with 1/2 foam 2x10 Supine pelvic tilt with dynadisc, x 10 anterior, x 5 posterior, limited by pain  Seated Paloff press  x 15 each side.   Therapeutic Activity: Log rolling technique for supine<->sit, reviewed x 2                                                                                                PATIENT EDUCATION:  Education details: rationale for interventions, HEP  Person educated: Patient Education method: Explanation, Demonstration, Tactile cues, Verbal cues Education comprehension: verbalized understanding, returned demonstration, verbal cues required, tactile cues required, and needs further education     HOME EXERCISE PROGRAM: Access Code: [email or web address] URL: [email or web address] Date: 04/06/2024 Prepared by: Corean Pouch  Exercises - Seated Slump Nerve Glide  - 1 x daily - 7 x weekly - 2 sets - 10 reps (instructed in way performed in the clinic with heel supported and moving through PF/DF in tolerable range) - Supine Posterior Pelvic Tilt  - 1 x daily - 7 x weekly - 2 sets - 10 reps  ASSESSMENT:  CLINICAL IMPRESSION: 04/21/2024: Pt arrives w/ report of some improvement in pain overall but continues to have RLE pain in evenings, some relief w/ HEP. Today we continue to expand core stability program and comfortable mobility. Best response to [facility] w/ pball. Tendency for muscle guarding and holding breath requiring  cues to correct. No adverse events, reports improved back/LE pain on departure. Recommend continuing along current POC in order to address relevant deficits and improve functional tolerance. Pt departs today's session in no acute distress, all voiced questions/concerns addressed appropriately from PT perspective.     EVAL: Patient is a pleasant 38 y.o. woman who was seen today for physical therapy evaluation and treatment for back pain ongoing over past few months. Increased time is required for exam/subjective given initial difficulties w/ video interpreting services.  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 Does not endorse any red flags. On exam she demonstrates good ROM of lumbar spine overall, although it is painful particularly into flexion. Flexion tolerance is modestly improved after repeated extension. LE strength testing is limited by pain. 5xSTS indicative of reduced mobility and fall risk. No adverse events, tolerates session well overall but does have fairly consistent pain levels throughout. Recommend trial of skilled PT to address aforementioned deficits with aim of improving functional tolerance and reducing pain with typical activities. Pt departs today's session in no acute distress, all voiced concerns/questions addressed appropriately from PT perspective.    OBJECTIVE IMPAIRMENTS: decreased activity tolerance, decreased endurance, decreased mobility, difficulty walking, decreased ROM, decreased strength, impaired perceived functional ability, improper body mechanics, postural dysfunction, and pain.   ACTIVITY LIMITATIONS: carrying, lifting, bending, sitting, standing, squatting, sleeping, stairs, transfers, locomotion level, and caring for others  PARTICIPATION LIMITATIONS: meal prep, cleaning, laundry, community activity, and occupation  PERSONAL FACTORS: Time since onset of  injury/illness/exacerbation and 3+ comorbidities: hx TB, insomnia, depression, headache are also affecting patient's functional outcome.   REHAB POTENTIAL: Good  CLINICAL DECISION MAKING: Evolving/moderate complexity  EVALUATION COMPLEXITY: Moderate   GOALS:   SHORT TERM GOALS: Target date: 04/20/2024  Pt will demonstrate appropriate understanding and performance of initially prescribed HEP in order to facilitate improved independence with management of symptoms.  Baseline: HEP TBD  04/19/24: reports good HEP adherence Goal status: MET  2. Pt will report at least 25% improvement in overall pain levels over past week in order to facilitate improved tolerance to typical daily activities.   Baseline: 8/10 on eval 04/19/24: still 7-8/10 on average although frequency of back pain reportedly improved Goal status: ONGOING  LONG TERM GOALS: Target date: 05/11/2024   Pt will improve at least 20% on ODI in order to demonstrate improved perception of functional status due to symptoms.  Baseline: 32/50, 64% Goal status: INITIAL  2.  Pt will demonstrate lumbar flexion AROM to at least distal shin with less than 3 pt increase in pain in order to demonstrate improved tolerance to functional movement patterns.   Baseline: see ROM chart above Goal status: INITIAL  3.  Pt will demonstrate bilateral hip/knee MMT of at least 4+/5 in order to demonstrate improved strength for functional movements.  Baseline: see MMT chart above Goal status: INITIAL  4. Pt will perform 5xSTS in <18 sec in order to demonstrate reduced fall risk and improved functional independence. (MCID of 2.3sec)  Baseline: 34sec UE support  Goal status: INITIAL   5. Pt will be able to tolerate sitting for >45 min in order to improve driving tolerance.  Baseline: pain >39min  Goal status: INITIAL  PLAN:  PT FREQUENCY: 2x/week  PT DURATION: 6 weeks  PLANNED INTERVENTIONS: 97164- PT Re-evaluation, 97750- Physical Performance  Testing, 97110-Therapeutic exercises, 97530- Therapeutic activity, 97112- Neuromuscular re-education, 97535- Self Care, [id number]- Manual therapy, [phone]- Gait training, [phone]- Aquatic Therapy, [phone] (1-2 muscles), [id number] (3+ muscles)- Dry Needling, Patient/Family education, Balance training, Stair training, Taping, Joint mobilization, Joint manipulation, Spinal mobilization, Cryotherapy, and Moist heat.  PLAN FOR NEXT SESSION: review/update HEP PRN. LE/core stability training. Symptom modification strategies as indicated/appropriate.    Alm DELENA Jenny PT, DPT 04/21/2024 3:30 PM

## 2024-04-26 ENCOUNTER — Ambulatory Visit

## 2024-04-26 NOTE — Therapy (Incomplete)
 OUTPATIENT PHYSICAL THERAPY TREATMENT NOTE   Patient Name: Hannah [name] MRN: [id number] DOB:1985-11-11, 38 y.o., female Today's Date: 04/26/2024  END OF SESSION:      Past Medical History:  Diagnosis Date   Infection    UTI in early preg   Tuberculosis    Past Surgical History:  Procedure Laterality Date   NO PAST SURGERIES     TUBAL LIGATION Bilateral 08/19/2014   Procedure: POST PARTUM TUBAL LIGATION;  Surgeon: Lang JINNY Peel, DO;  Location: [facility];  Service: Gynecology;  Laterality: Bilateral;   Patient Active Problem List   Diagnosis Date Noted   Tension-type headache, not intractable 12/24/2016   Insomnia 12/24/2016   Depression 12/24/2016   Diabetes mellitus screening 11/10/2016    PCP: No PCP in chart  REFERRING PROVIDER: Jule Ronal CROME, PA-C  REFERRING DIAG: [phone] (ICD-10-CM) - Chronic right-sided low back pain with right-sided sciatica  Rationale for Evaluation and Treatment: Rehabilitation  THERAPY DIAG:  No diagnosis found.  ONSET DATE: 5-6 months  SUBJECTIVE:                                                                                                                                                                                           SUBJECTIVE STATEMENT: ***  04/26/2024: Patient reports that she has more pain in the morning, especially from her hip down to the ankle.    EVAL: Accompanied by daughter per pt request. Appreciate assistance of video interpreter although there are some initial difficulties getting set up. Pt reports onset of pain 5-6 months ago without MOI or clear change in activity. 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 States symptoms have improved somewhat since onset but remain severe. Has been to ED a few times and states she was told she has sciatica. Describes R back + LE symptoms that go from hip,  posterolateral thigh into calf/heel.  No L sided symptoms. Describes pain as electric shock at times but doesn't endorse clear N/T. Denies any saddle anesthesia or sensory changes. Denies any issues w/ bowel/bladder, although she does endorse some abdominal pain which she states she has reached out to provider about.   PERTINENT HISTORY:  hx TB, insomnia, depression, headache   PAIN:  Are you having pain: 7/10 back, 5/10 RLE (posterior thigh only)  Per eval:  Location/description: R sided low back into thigh, posterior calf/heel; throbbing - aggravating factors: driving, sitting >89fpw, sleeping - Easing factors: movement changes, walking, lying down, heating pad   PRECAUTIONS: None  RED  FLAGS: none   WEIGHT BEARING RESTRICTIONS: No  FALLS:  Has patient fallen in last 6 months? 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  DIAGNOSTIC FINDINGS:  03/11/24 Lumbar XR: X-rays demonstrate slight rotational deformity of the lumbar spine.   Otherwise, no acute or structural abnormalities.   PATIENT SURVEYS:  ODI: 35/50  COGNITION: Overall cognitive status: Within functional limits for tasks assessed     SENSATION: Does not endorse any sensory complaints    POSTURE: lateral shift towards L, posterior lean   LUMBAR ROM:   AROM eval  Flexion Knee, *  Extension 75%   Right lateral flexion   Left lateral flexion   Right rotation 75% *  Left rotation 75% *    (Blank rows = not tested) (Key: WFL = within functional limits not formally assessed, * = concordant pain, s = stiffness/stretching sensation, NT = not tested) Comment: second attempt at flexion with mild improvement after repeated ext x5  LOWER EXTREMITY MMT:    MMT Right eval Left eval  Hip flexion 4- 4  Hip  abduction (modified sitting)    Hip internal rotation    Hip external rotation    Knee flexion 4- * 4+  Knee extension 4- * 4+  Ankle dorsiflexion 4+ 4+   (Blank rows = not tested) (Key: WFL = within functional limits not formally assessed, * = concordant pain, s = stiffness/stretching sensation, NT = not tested)  Comments:    LUMBAR SPECIAL TESTS:  Mild improvement in flexion tolerance following repeated ext  04/06/24: Slump test: Positive BIL (Pain on right when performed on Left)  FUNCTIONAL TESTS:  5xSTS: 34.14sec UE support from thighs  GAIT: Distance walked: within clinic Assistive device utilized: None Level of assistance: Complete Independence Comments: mildly reduced stance time RLE, reduced truncal rotation and arm swing  TREATMENT DATE:  [facility] Adult PT Treatment:                                                DATE: 04/26/24 Therapeutic Exercise: Nustep level 3 x 5 mins DTKC w/ pball 2x10 cues for gentle rocking and comfortable ROM Supine LTR with pball  PPT + Bridge, 3 x 5  Standing red band hip abd 3x8 BIL cues for posture  Standing triple flex/ext within comfortable ROM 2x10 BIL  Neuromuscular re-ed: Hooklying pelvic tilt x10 cues for motor control PPT hold x30sec cues for breath control Standing red band paloff 2x8 cues for posture  [facility] Adult PT Treatment:                                                DATE: 04/21/2024  Therapeutic Exercise: DTKC w/ pball 2x10 cues for gentle rocking and comfortable ROM Supine LTR with pball  PPT + Bridge, 3 x 5  Standing red band hip abd 3x8 BIL cues for posture  Standing triple flex/ext within comfortable ROM 2x10 BIL   Neuromuscular re-ed: Hooklying pelvic tilt x10 cues for motor control PPT hold x30sec cues for breath control Standing red band paloff  2x8 cues for posture  [facility] Adult PT Treatment:                                                DATE: 04/19/24 Therapeutic Exercise: DTKC w/ pball 2x10 cues for gentle  rocking and comfortable ROM Standing red band hip abd 3x8 BIL cues for posture  Standing triple flex/ext within comfortable ROM 2x10 BIL   Neuromuscular re-ed: Hooklying pelvic tilt x10 cues for motor control PPT hold x30sec cues for breath control Hooklying pelvic tilt + slow march 2x8 BIL  Sciatic nerve glides, supine on pball 2x8 RLE Standing red band paloff 2x8 cues for posture    PATIENT EDUCATION:  Education details: rationale for interventions, HEP  Person educated: Patient Education method: Explanation, Demonstration, Tactile cues, Verbal cues Education comprehension: verbalized understanding, returned demonstration, verbal cues required, tactile cues required, and needs further education     HOME EXERCISE PROGRAM: Access Code: [email or web address] URL: [email or web address] Date: 04/06/2024 Prepared by: Corean Pouch  Exercises - Seated Slump Nerve Glide  - 1 x daily - 7 x weekly - 2 sets - 10 reps (instructed in way performed in the clinic with heel supported and moving through PF/DF in tolerable range) - Supine Posterior Pelvic Tilt  - 1 x daily - 7 x weekly - 2 sets - 10 reps  ASSESSMENT:  CLINICAL IMPRESSION: ***  04/26/2024: Bartholome had improved tolerance of today's session. She will likely benefit from incorporation of low impact aerobic activity and progression of isometric core strengthening progression as tolerated. We will continue to progress per POC.    EVAL: Patient is a pleasant 38 y.o. woman who was seen today for physical therapy evaluation and treatment for back pain ongoing over past few months. Increased time is required for exam/subjective given initial difficulties w/ video interpreting services. 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 Does not endorse any red flags. On exam she demonstrates good ROM of lumbar spine overall, although it is painful  particularly into flexion. Flexion tolerance is modestly improved after repeated extension. LE strength testing is limited by pain. 5xSTS indicative of reduced mobility and fall risk. No adverse events, tolerates session well overall but does have fairly consistent pain levels throughout. Recommend trial of skilled PT to address aforementioned deficits with aim of improving functional tolerance and reducing pain with typical activities. Pt departs today's session in no acute distress, all voiced concerns/questions addressed appropriately from PT perspective.    OBJECTIVE IMPAIRMENTS: decreased activity tolerance, decreased endurance, decreased mobility, difficulty walking, decreased ROM, decreased strength, impaired perceived functional ability, improper body mechanics, postural dysfunction, and pain.   ACTIVITY LIMITATIONS: carrying, lifting, bending, sitting, standing, squatting, sleeping, stairs, transfers, locomotion level, and caring for others  PARTICIPATION LIMITATIONS: meal prep, cleaning, laundry, community activity, and occupation  PERSONAL FACTORS: Time since onset of injury/illness/exacerbation and 3+ comorbidities: hx TB, insomnia, depression, headache are also affecting patient's functional outcome.   REHAB POTENTIAL: Good  CLINICAL DECISION MAKING: Evolving/moderate complexity  EVALUATION COMPLEXITY: Moderate   GOALS:   SHORT TERM GOALS: Target date: 04/20/2024  Pt will demonstrate appropriate understanding and performance of initially prescribed HEP in order to facilitate improved independence with management of symptoms.  Baseline: HEP TBD  04/19/24: reports good HEP adherence Goal status: MET  2. Pt will  report at least 25% improvement in overall pain levels over past week in order to facilitate improved tolerance to typical daily activities.   Baseline: 8/10 on eval 04/19/24: still 7-8/10 on average although frequency of back pain reportedly improved Goal status:  ONGOING  LONG TERM GOALS: Target date: 05/11/2024   Pt will improve at least 20% on ODI in order to demonstrate improved perception of functional status due to symptoms.  Baseline: 32/50, 64% Goal status: INITIAL  2.  Pt will demonstrate lumbar flexion AROM to at least distal shin with less than 3 pt increase in pain in order to demonstrate improved tolerance to functional movement patterns.   Baseline: see ROM chart above Goal status: INITIAL  3.  Pt will demonstrate bilateral hip/knee MMT of at least 4+/5 in order to demonstrate improved strength for functional movements.  Baseline: see MMT chart above Goal status: INITIAL  4. Pt will perform 5xSTS in <18 sec in order to demonstrate reduced fall risk and improved functional independence. (MCID of 2.3sec)  Baseline: 34sec UE support  Goal status: INITIAL   5. Pt will be able to tolerate sitting for >45 min in order to improve driving tolerance.  Baseline: pain >66min  Goal status: INITIAL  PLAN:  PT FREQUENCY: 2x/week  PT DURATION: 6 weeks  PLANNED INTERVENTIONS: 97164- PT Re-evaluation, 97750- Physical Performance Testing, 97110-Therapeutic exercises, 97530- Therapeutic activity, 97112- Neuromuscular re-education, 97535- Self Care, 02859- Manual therapy, [phone]- Gait training, [phone]- Aquatic Therapy, [phone] (1-2 muscles), [phone] (3+ muscles)- Dry Needling, Patient/Family education, Balance training, Stair training, Taping, Joint mobilization, Joint manipulation, Spinal mobilization, Cryotherapy, and Moist heat.  PLAN FOR NEXT SESSION: review/update HEP PRN. LE/core stability training. Symptom modification strategies as indicated/appropriate.    Corean Pouch PTA  04/26/2024 8:07 AM

## 2024-04-27 ENCOUNTER — Other Ambulatory Visit: Payer: Self-pay

## 2024-04-27 DIAGNOSIS — R102 Pelvic and perineal pain: Secondary | ICD-10-CM

## 2024-04-27 DIAGNOSIS — N946 Dysmenorrhea, unspecified: Secondary | ICD-10-CM

## 2024-04-27 DIAGNOSIS — N920 Excessive and frequent menstruation with regular cycle: Secondary | ICD-10-CM

## 2024-04-28 ENCOUNTER — Ambulatory Visit
Admission: RE | Admit: 2024-04-28 | Discharge: 2024-04-28 | Disposition: A | Discharge status: Home / Self Care | Source: Ambulatory Visit

## 2024-04-28 DIAGNOSIS — N946 Dysmenorrhea, unspecified: Secondary | ICD-10-CM

## 2024-04-28 DIAGNOSIS — N920 Excessive and frequent menstruation with regular cycle: Secondary | ICD-10-CM

## 2024-04-28 DIAGNOSIS — R102 Pelvic and perineal pain: Secondary | ICD-10-CM

## 2024-04-29 ENCOUNTER — Ambulatory Visit

## 2024-04-29 DIAGNOSIS — M5459 Other low back pain: Secondary | ICD-10-CM

## 2024-04-29 NOTE — Therapy (Signed)
 OUTPATIENT PHYSICAL THERAPY TREATMENT NOTE   Patient Name: Hannah [name] MRN: [id number] DOB:10-Aug-1986, 38 y.o., female Today's Date: 04/29/2024  END OF SESSION:  PT End of Session - 04/29/24 1053     Visit Number 7    Number of Visits 13    Date for PT Re-Evaluation 05/11/24    Authorization Type [employer]    Authorization Time Period 9 visits approved 03/30/24-05/28/24    Authorization - Visit Number 6    Authorization - Number of Visits 9    PT Start Time 1047    PT Stop Time 1117    PT Time Calculation (min) 30 min    Activity Tolerance Patient tolerated treatment well    Behavior During Therapy [facility] for tasks assessed/performed             Past Medical History:  Diagnosis Date   Infection    UTI in early preg   Tuberculosis    Past Surgical History:  Procedure Laterality Date   NO PAST SURGERIES     TUBAL LIGATION Bilateral 08/19/2014   Procedure: POST PARTUM TUBAL LIGATION;  Surgeon: Lang JINNY Peel, DO;  Location: [facility];  Service: Gynecology;  Laterality: Bilateral;   Patient Active Problem List   Diagnosis Date Noted   Tension-type headache, not intractable 12/24/2016   Insomnia 12/24/2016   Depression 12/24/2016   Diabetes mellitus screening 11/10/2016    PCP: No PCP in chart  REFERRING PROVIDER: Jule Ronal CROME, PA-C  REFERRING DIAG: [phone] (ICD-10-CM) - Chronic right-sided low back pain with right-sided sciatica  Rationale for Evaluation and Treatment: Rehabilitation  THERAPY DIAG:  Other low back pain  ONSET DATE: 5-6 months  SUBJECTIVE:                                                                                                                                                                                           SUBJECTIVE STATEMENT: 04/29/2024: Patient presents to PT with 5/10 pain.    EVAL: Accompanied by daughter per pt request. Appreciate assistance of video interpreter although there are some initial  difficulties getting set up. Pt reports onset of pain 5-6 months ago without MOI or clear change in activity. 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 States symptoms have improved somewhat since onset but remain severe. Has been to ED a few times and states she was told she has sciatica. Describes R back + LE symptoms that go from hip, posterolateral thigh into calf/heel.  No L sided symptoms. Describes  pain as electric shock at times but doesn't endorse clear N/T. Denies any saddle anesthesia or sensory changes. Denies any issues w/ bowel/bladder, although she does endorse some abdominal pain which she states she has reached out to provider about.   PERTINENT HISTORY:  hx TB, insomnia, depression, headache   PAIN:  Are you having pain: 7/10 back, 5/10 RLE (posterior thigh only)  Per eval:  Location/description: R sided low back into thigh, posterior calf/heel; throbbing - aggravating factors: driving, sitting >89fpw, sleeping - Easing factors: movement changes, walking, lying down, heating pad   PRECAUTIONS: None  RED FLAGS: none   WEIGHT BEARING RESTRICTIONS: No  FALLS:  Has patient fallen in last 6 months? 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  DIAGNOSTIC FINDINGS:  03/11/24 Lumbar XR: X-rays demonstrate slight rotational deformity of the lumbar spine.   Otherwise, no acute or structural abnormalities.   PATIENT SURVEYS:  ODI: 35/50  COGNITION: Overall cognitive status: Within functional limits for tasks assessed     SENSATION: Does not endorse any sensory complaints    POSTURE: lateral shift towards L, posterior lean   LUMBAR ROM:   AROM eval  Flexion  Knee, *  Extension 75%   Right lateral flexion   Left lateral flexion   Right rotation 75% *  Left rotation 75% *    (Blank rows = not tested) (Key: WFL = within functional limits not formally assessed, * = concordant pain, s = stiffness/stretching sensation, NT = not tested) Comment: second attempt at flexion with mild improvement after repeated ext x5  LOWER EXTREMITY MMT:    MMT Right eval Left eval  Hip flexion 4- 4  Hip abduction (modified sitting)    Hip internal rotation    Hip external rotation    Knee flexion 4- * 4+  Knee extension 4- * 4+  Ankle dorsiflexion 4+ 4+   (Blank rows = not tested) (Key: WFL = within functional limits not formally assessed, * = concordant pain, s = stiffness/stretching sensation, NT = not tested)  Comments:    LUMBAR SPECIAL TESTS:  Mild improvement in flexion tolerance following repeated ext  04/06/24: Slump test: Positive BIL (Pain on right when performed on Left)  FUNCTIONAL TESTS:  5xSTS: 34.14sec UE support from thighs  GAIT: Distance walked: within clinic Assistive device utilized: None Level of assistance: Complete Independence Comments: mildly reduced stance time RLE, reduced truncal rotation and arm swing  TREATMENT DATE:   [facility] Adult PT Treatment:                                                DATE: 04/29/2024   Therapeutic Exercise: DTKC w/ pball 2x10 cues for gentle rocking and comfortable ROM Supine LTR with pball  90/90 hold, 5 sec x 5 90/90 march -- begin nxt visit d/t recent ultrasound  Standing red band hip abd 3x8 BIL cues for posture  Standing triple flex/ext within comfortable ROM 2x10 BIL -- held today d/t recent ultrasound  NuStep level 2 x 5 min at end of session  Neuromuscular re-ed: PPT + Bridge, 2 x 10   Hooklying knee fallouts 2 x 10 each RTB  Standing red band  paloff 2x10 cues for posture  [facility] Adult PT Treatment:                                                DATE: 04/21/2024  Therapeutic  Exercise: DTKC w/ pball 2x10 cues for gentle rocking and comfortable ROM Supine LTR with pball  PPT + Bridge, 3 x 5  Standing red band hip abd 3x8 BIL cues for posture  Standing triple flex/ext within comfortable ROM 2x10 BIL   Neuromuscular re-ed: Hooklying pelvic tilt x10 cues for motor control PPT hold x30sec cues for breath control Standing red band paloff 2x8 cues for posture  [facility] Adult PT Treatment:                                                DATE: 04/19/24 Therapeutic Exercise: DTKC w/ pball 2x10 cues for gentle rocking and comfortable ROM Standing red band hip abd 3x8 BIL cues for posture  Standing triple flex/ext within comfortable ROM 2x10 BIL   Neuromuscular re-ed: Hooklying pelvic tilt x10 cues for motor control PPT hold x30sec cues for breath control Hooklying pelvic tilt + slow march 2x8 BIL  Sciatic nerve glides, supine on pball 2x8 RLE Standing red band paloff 2x8 cues for posture                                                                                                 PATIENT EDUCATION:  Education details: rationale for interventions, HEP  Person educated: Patient Education method: Explanation, Demonstration, Tactile cues, Verbal cues Education comprehension: verbalized understanding, returned demonstration, verbal cues required, tactile cues required, and needs further education     HOME EXERCISE PROGRAM: Access Code: [email or web address] URL: [email or web address] Date: 04/06/2024 Prepared by: Corean Pouch  Exercises - Seated Slump Nerve Glide  - 1 x daily - 7 x weekly - 2 sets - 10 reps (instructed in way performed in the clinic with heel supported and moving through PF/DF in tolerable range) - Supine Posterior Pelvic Tilt  - 1 x daily - 7 x weekly - 2 sets - 10 reps  ASSESSMENT:  CLINICAL IMPRESSION: 04/29/2024: Hannah [name] had good tolerance of today's treatment session, which focused on increased time with isometric core strengthening  and lumbar flexion activities. Patient had some difficulty with lumbar flexion d/t recent ultrasound assessment yesterday and resulting abdominal pain. We will continue to progress per POC as tolerated, in order to reach established rehab goals.     EVAL: Patient is a pleasant 38 y.o. woman who was seen today for physical therapy evaluation and treatment for back pain ongoing over past few months. Increased time is required for exam/subjective given initial difficulties w/ video interpreting services. 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 Does not endorse any red flags.  On exam she demonstrates good ROM of lumbar spine overall, although it is painful particularly into flexion. Flexion tolerance is modestly improved after repeated extension. LE strength testing is limited by pain. 5xSTS indicative of reduced mobility and fall risk. No adverse events, tolerates session well overall but does have fairly consistent pain levels throughout. Recommend trial of skilled PT to address aforementioned deficits with aim of improving functional tolerance and reducing pain with typical activities. Pt departs today's session in no acute distress, all voiced concerns/questions addressed appropriately from PT perspective.    OBJECTIVE IMPAIRMENTS: decreased activity tolerance, decreased endurance, decreased mobility, difficulty walking, decreased ROM, decreased strength, impaired perceived functional ability, improper body mechanics, postural dysfunction, and pain.   ACTIVITY LIMITATIONS: carrying, lifting, bending, sitting, standing, squatting, sleeping, stairs, transfers, locomotion level, and caring for others  PARTICIPATION LIMITATIONS: meal prep, cleaning, laundry, community activity, and occupation  PERSONAL FACTORS: Time since onset of injury/illness/exacerbation and 3+ comorbidities: hx TB, insomnia, depression,  headache are also affecting patient's functional outcome.   REHAB POTENTIAL: Good  CLINICAL DECISION MAKING: Evolving/moderate complexity  EVALUATION COMPLEXITY: Moderate   GOALS:   SHORT TERM GOALS: Target date: 04/20/2024  Pt will demonstrate appropriate understanding and performance of initially prescribed HEP in order to facilitate improved independence with management of symptoms.  Baseline: HEP TBD  04/19/24: reports good HEP adherence Goal status: MET  2. Pt will report at least 25% improvement in overall pain levels over past week in order to facilitate improved tolerance to typical daily activities.   Baseline: 8/10 on eval 04/19/24: still 7-8/10 on average although frequency of back pain reportedly improved Goal status: ONGOING  LONG TERM GOALS: Target date: 05/11/2024   Pt will improve at least 20% on ODI in order to demonstrate improved perception of functional status due to symptoms.  Baseline: 32/50, 64% Goal status: INITIAL  2.  Pt will demonstrate lumbar flexion AROM to at least distal shin with less than 3 pt increase in pain in order to demonstrate improved tolerance to functional movement patterns.   Baseline: see ROM chart above Goal status: INITIAL  3.  Pt will demonstrate bilateral hip/knee MMT of at least 4+/5 in order to demonstrate improved strength for functional movements.  Baseline: see MMT chart above Goal status: INITIAL  4. Pt will perform 5xSTS in <18 sec in order to demonstrate reduced fall risk and improved functional independence. (MCID of 2.3sec)  Baseline: 34sec UE support  Goal status: INITIAL   5. Pt will be able to tolerate sitting for >45 min in order to improve driving tolerance.  Baseline: pain >16min  Goal status: INITIAL  PLAN:  PT FREQUENCY: 2x/week  PT DURATION: 6 weeks  PLANNED INTERVENTIONS: 97164- PT Re-evaluation, 97750- Physical Performance Testing, 97110-Therapeutic exercises, 97530- Therapeutic activity, 97112-  Neuromuscular re-education, 97535- Self Care, [id number]- Manual therapy, [phone]- Gait training, [phone]- Aquatic Therapy, [phone] (1-2 muscles), [id number] (3+ muscles)- Dry Needling, Patient/Family education, Balance training, Stair training, Taping, Joint mobilization, Joint manipulation, Spinal mobilization, Cryotherapy, and Moist heat.  PLAN FOR NEXT SESSION: review/update HEP PRN. LE/core stability training. Symptom modification strategies as indicated/appropriate.    Marko Molt, PT, DPT  04/29/2024 11:57 AM

## 2024-05-03 ENCOUNTER — Ambulatory Visit

## 2024-05-03 DIAGNOSIS — M5459 Other low back pain: Secondary | ICD-10-CM | POA: Diagnosis not present

## 2024-05-03 NOTE — Therapy (Signed)
 OUTPATIENT PHYSICAL THERAPY TREATMENT NOTE   Patient Name: Kaeden Depaz MRN: [id number] DOB:11/22/1985, 38 y.o., female Today's Date: 05/03/2024  END OF SESSION:  PT End of Session - 05/03/24 1054     Visit Number 8    Number of Visits 13    Date for PT Re-Evaluation 05/11/24    Authorization Type [employer]    Authorization Time Period 9 visits approved 03/30/24-05/28/24    Authorization - Visit Number 7    Authorization - Number of Visits 9    PT Start Time 1047    PT Stop Time 1125    PT Time Calculation (min) 38 min    Activity Tolerance Patient tolerated treatment well    Behavior During Therapy WFL for tasks assessed/performed              Past Medical History:  Diagnosis Date   Infection    UTI in early preg   Tuberculosis    Past Surgical History:  Procedure Laterality Date   NO PAST SURGERIES     TUBAL LIGATION Bilateral 08/19/2014   Procedure: POST PARTUM TUBAL LIGATION;  Surgeon: Lang JINNY Peel, DO;  Location: [facility];  Service: Gynecology;  Laterality: Bilateral;   Patient Active Problem List   Diagnosis Date Noted   Tension-type headache, not intractable 12/24/2016   Insomnia 12/24/2016   Depression 12/24/2016   Diabetes mellitus screening 11/10/2016    PCP: No PCP in chart  REFERRING PROVIDER: Jule Ronal CROME, PA-C  REFERRING DIAG: [phone] (ICD-10-CM) - Chronic right-sided low back pain with right-sided sciatica  Rationale for Evaluation and Treatment: Rehabilitation  THERAPY DIAG:  Other low back pain  ONSET DATE: 5-6 months  SUBJECTIVE:                                                                                                                                                                                           SUBJECTIVE STATEMENT: 05/03/2024: Patient reports that she doesn't have as much pain as last week. The lower abdominal pain from her ultrasound has resolved. She has not experienced distal LE pain over  the past couple of days. She continues to walk and perform HEP daily.    EVAL: Accompanied by daughter per pt request. Appreciate assistance of video interpreter although there are some initial difficulties getting set up. Pt reports onset of pain 5-6 months ago without MOI or clear change in activity. 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 States symptoms have improved somewhat since onset but remain  severe. Has been to ED a few times and states she was told she has sciatica. Describes R back + LE symptoms that go from hip, posterolateral thigh into calf/heel.  No L sided symptoms. Describes pain as electric shock at times but doesn't endorse clear N/T. Denies any saddle anesthesia or sensory changes. Denies any issues w/ bowel/bladder, although she does endorse some abdominal pain which she states she has reached out to provider about.   PERTINENT HISTORY:  hx TB, insomnia, depression, headache   PAIN:  Are you having pain: 7/10 back, 5/10 RLE (posterior thigh only)  Per eval:  Location/description: R sided low back into thigh, posterior calf/heel; throbbing - aggravating factors: driving, sitting >89fpw, sleeping - Easing factors: movement changes, walking, lying down, heating pad   PRECAUTIONS: None  RED FLAGS: none   WEIGHT BEARING RESTRICTIONS: No  FALLS:  Has patient fallen in last 6 months? 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  DIAGNOSTIC FINDINGS:  03/11/24 Lumbar XR: X-rays demonstrate slight rotational deformity of the lumbar spine.   Otherwise, no acute or structural abnormalities.   PATIENT SURVEYS:  ODI: 35/50  COGNITION: Overall cognitive status:  Within functional limits for tasks assessed     SENSATION: Does not endorse any sensory complaints    POSTURE: lateral shift towards L, posterior lean   LUMBAR ROM:   AROM eval  Flexion Knee, *  Extension 75%   Right lateral flexion   Left lateral flexion   Right rotation 75% *  Left rotation 75% *    (Blank rows = not tested) (Key: WFL = within functional limits not formally assessed, * = concordant pain, s = stiffness/stretching sensation, NT = not tested) Comment: second attempt at flexion with mild improvement after repeated ext x5  LOWER EXTREMITY MMT:    MMT Right eval Left eval  Hip flexion 4- 4  Hip abduction (modified sitting)    Hip internal rotation    Hip external rotation    Knee flexion 4- * 4+  Knee extension 4- * 4+  Ankle dorsiflexion 4+ 4+   (Blank rows = not tested) (Key: WFL = within functional limits not formally assessed, * = concordant pain, s = stiffness/stretching sensation, NT = not tested)  Comments:    LUMBAR SPECIAL TESTS:  Mild improvement in flexion tolerance following repeated ext  04/06/24: Slump test: Positive BIL (Pain on right when performed on Left)  FUNCTIONAL TESTS:  5xSTS: 34.14sec UE support from thighs  GAIT: Distance walked: within clinic Assistive device utilized: None Level of assistance: Complete Independence Comments: mildly reduced stance time RLE, reduced truncal rotation and arm swing  TREATMENT DATE:   [facility] Adult PT Treatment:                                                DATE: 05/03/2024   Therapeutic Exercise: NuStep level 3 x 5 min at start of session concurrent with subject assessment  DTKC w/ pball x 15 cues for gentle rocking and comfortable ROM Supine LTR with pball x 15 ea  90/90 hold, 20 sec x 5 90/90 march 2 x 5 cues for form  Standing green band hip abd  x 10 each BIL  Standing triple flex/ext within comfortable ROM x 15 ea  Neuromuscular re-ed: PPT + Bridge, 2 x 10   Hooklying knee  fallouts 2 x 10 each RTB  Standing red band paloff x15 cues for posture   [facility] Adult PT Treatment:                                                DATE: 04/29/2024   Therapeutic Exercise: DTKC w/ pball 2x10 cues for gentle rocking and comfortable ROM Supine LTR with pball  90/90 hold, 5 sec x 5 90/90 march -- begin nxt visit d/t recent ultrasound  Standing red band hip abd 3x8 BIL cues for posture  Standing triple flex/ext within comfortable ROM 2x10 BIL -- held today d/t recent ultrasound  NuStep level 2 x 5 min at end of session  Neuromuscular re-ed: PPT + Bridge, 2 x 10   Hooklying knee fallouts 2 x 10 each RTB  Standing red band paloff 2x10 cues for posture  [facility] Adult PT Treatment:                                                DATE: 04/21/2024  Therapeutic Exercise: DTKC w/ pball 2x10 cues for gentle rocking and comfortable ROM Supine LTR with pball  PPT + Bridge, 3 x 5  Standing red band hip abd 3x8 BIL cues for posture  Standing triple flex/ext within comfortable ROM 2x10 BIL   Neuromuscular re-ed: Hooklying pelvic tilt x10 cues for motor control PPT hold x30sec cues for breath control Standing red band paloff 2x8 cues for posture                                                                                                  PATIENT EDUCATION:  Education details: rationale for interventions, HEP  Person educated: Patient Education method: Explanation, Demonstration, Tactile cues, Verbal cues Education comprehension: verbalized understanding, returned demonstration, verbal cues required, tactile cues required, and needs further education     HOME EXERCISE PROGRAM: Access Code: [email or web address] URL: [email or web address] Date: 04/06/2024 Prepared by: Corean Pouch  Exercises - Seated Slump Nerve Glide  - 1 x daily - 7 x weekly - 2 sets - 10 reps (instructed in way performed in the clinic with heel supported and moving through PF/DF in tolerable  range) - Supine Posterior Pelvic Tilt  - 1 x daily - 7 x weekly - 2 sets - 10 reps  ASSESSMENT:  CLINICAL IMPRESSION: 05/03/2024: Bartholome had good tolerance of today's treatment session, which focused on increased core stabilization and hip strengthening activities. She is demonstrating improved core mm endurance, with decreased radicular symptoms. We will continue to progress per POC as tolerated, in order to reach established rehab goals.    EVAL: Patient is a pleasant 38 y.o. woman who was  seen today for physical therapy evaluation and treatment for back pain ongoing over past few months. Increased time is required for exam/subjective given initial difficulties w/ video interpreting services. 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 Does not endorse any red flags. On exam she demonstrates good ROM of lumbar spine overall, although it is painful particularly into flexion. Flexion tolerance is modestly improved after repeated extension. LE strength testing is limited by pain. 5xSTS indicative of reduced mobility and fall risk. No adverse events, tolerates session well overall but does have fairly consistent pain levels throughout. Recommend trial of skilled PT to address aforementioned deficits with aim of improving functional tolerance and reducing pain with typical activities. Pt departs today's session in no acute distress, all voiced concerns/questions addressed appropriately from PT perspective.    OBJECTIVE IMPAIRMENTS: decreased activity tolerance, decreased endurance, decreased mobility, difficulty walking, decreased ROM, decreased strength, impaired perceived functional ability, improper body mechanics, postural dysfunction, and pain.   ACTIVITY LIMITATIONS: carrying, lifting, bending, sitting, standing, squatting, sleeping, stairs, transfers, locomotion level, and caring for others  PARTICIPATION  LIMITATIONS: meal prep, cleaning, laundry, community activity, and occupation  PERSONAL FACTORS: Time since onset of injury/illness/exacerbation and 3+ comorbidities: hx TB, insomnia, depression, headache are also affecting patient's functional outcome.   REHAB POTENTIAL: Good  CLINICAL DECISION MAKING: Evolving/moderate complexity  EVALUATION COMPLEXITY: Moderate   GOALS:   SHORT TERM GOALS: Target date: 04/20/2024  Pt will demonstrate appropriate understanding and performance of initially prescribed HEP in order to facilitate improved independence with management of symptoms.  Baseline: HEP TBD  04/19/24: reports good HEP adherence Goal status: MET  2. Pt will report at least 25% improvement in overall pain levels over past week in order to facilitate improved tolerance to typical daily activities.   Baseline: 8/10 on eval 04/19/24: still 7-8/10 on average although frequency of back pain reportedly improved Goal status: ONGOING  LONG TERM GOALS: Target date: 05/11/2024   Pt will improve at least 20% on ODI in order to demonstrate improved perception of functional status due to symptoms.  Baseline: 32/50, 64% Goal status: INITIAL  2.  Pt will demonstrate lumbar flexion AROM to at least distal shin with less than 3 pt increase in pain in order to demonstrate improved tolerance to functional movement patterns.   Baseline: see ROM chart above Goal status: INITIAL  3.  Pt will demonstrate bilateral hip/knee MMT of at least 4+/5 in order to demonstrate improved strength for functional movements.  Baseline: see MMT chart above Goal status: INITIAL  4. Pt will perform 5xSTS in <18 sec in order to demonstrate reduced fall risk and improved functional independence. (MCID of 2.3sec)  Baseline: 34sec UE support  Goal status: INITIAL   5. Pt will be able to tolerate sitting for >45 min in order to improve driving tolerance.  Baseline: pain >82min  Goal status: INITIAL  PLAN:  PT  FREQUENCY: 2x/week  PT DURATION: 6 weeks  PLANNED INTERVENTIONS: 97164- PT Re-evaluation, 97750- Physical Performance Testing, 97110-Therapeutic exercises, 97530- Therapeutic activity, 97112- Neuromuscular re-education, 97535- Self Care, [id number]- Manual therapy, [phone]- Gait training, [phone]- Aquatic Therapy, [phone] (1-2 muscles), [id number] (3+ muscles)- Dry Needling, Patient/Family education, Balance training, Stair training, Taping, Joint mobilization, Joint manipulation, Spinal mobilization, Cryotherapy, and Moist heat.  PLAN FOR NEXT SESSION: review/update HEP PRN. LE/core stability training. Symptom modification strategies as indicated/appropriate.    Marko Molt, PT, DPT  05/03/2024 11:29 AM

## 2024-05-05 ENCOUNTER — Ambulatory Visit

## 2024-05-05 DIAGNOSIS — M5459 Other low back pain: Secondary | ICD-10-CM

## 2024-05-05 NOTE — Therapy (Signed)
 OUTPATIENT PHYSICAL THERAPY TREATMENT NOTE   Patient Name: Hannah [name] MRN: [id number] DOB:[birth year], 38 y.o., female Today's Date: 05/05/2024  END OF SESSION:  PT End of Session - 05/05/24 0922     Visit Number 9    Number of Visits 13    Date for PT Re-Evaluation 05/11/24    Authorization Type [employer] blue    Authorization Time Period 9 visits approved 03/30/24-05/28/24    Authorization - Visit Number 8    Authorization - Number of Visits 9    PT Start Time 0917    PT Stop Time 0950    PT Time Calculation (min) 33 min    Activity Tolerance Patient tolerated treatment well    Behavior During Therapy [facility] for tasks assessed/performed               Past Medical History:  Diagnosis Date   Infection    UTI in early preg   Tuberculosis    Past Surgical History:  Procedure Laterality Date   NO PAST SURGERIES     TUBAL LIGATION Bilateral 08/19/2014   Procedure: POST PARTUM TUBAL LIGATION;  Surgeon: Lang JINNY Peel, DO;  Location: [facility];  Service: Gynecology;  Laterality: Bilateral;   Patient Active Problem List   Diagnosis Date Noted   Tension-type headache, not intractable 12/24/2016   Insomnia 12/24/2016   Depression 12/24/2016   Diabetes mellitus screening 11/10/2016    PCP: No PCP in chart  REFERRING PROVIDER: Jule Ronal CROME, PA-C  REFERRING DIAG: [phone] (ICD-10-CM) - Chronic right-sided low back pain with right-sided sciatica  Rationale for Evaluation and Treatment: Rehabilitation  THERAPY DIAG:  Other low back pain  ONSET DATE: 5-6 months  SUBJECTIVE:                                                                                                                                                                                           SUBJECTIVE STATEMENT: 05/05/2024:Patient reports that she is feeling better today with decreased pain severity to 5/10, as compared to her normal of 8/10.  We are assisted by [employer] video interpreter  [phone]   EVAL: Accompanied by daughter per pt request. Appreciate assistance of video interpreter although there are some initial difficulties getting set up. Pt reports onset of pain 5-6 months ago without MOI or clear change in activity. 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 States symptoms have improved somewhat since onset but remain severe. Has been to ED a few times and states she was told  she has sciatica. Describes R back + LE symptoms that go from hip, posterolateral thigh into calf/heel.  No L sided symptoms. Describes pain as electric shock at times but doesn't endorse clear N/T. Denies any saddle anesthesia or sensory changes. Denies any issues w/ bowel/bladder, although she does endorse some abdominal pain which she states she has reached out to provider about.   PERTINENT HISTORY:  hx TB, insomnia, depression, headache   PAIN:  Are you having pain: 7/10 back, 5/10 RLE (posterior thigh only)  Per eval:  Location/description: R sided low back into thigh, posterior calf/heel; throbbing - aggravating factors: driving, sitting >89fpw, sleeping - Easing factors: movement changes, walking, lying down, heating pad   PRECAUTIONS: None  RED FLAGS: none   WEIGHT BEARING RESTRICTIONS: No  FALLS:  Has patient fallen in last 6 months? 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  DIAGNOSTIC FINDINGS:  03/11/24 Lumbar XR: X-rays demonstrate slight rotational deformity of the lumbar spine.   Otherwise, no acute or structural abnormalities.   PATIENT SURVEYS:  ODI: 35/50  COGNITION: Overall cognitive status: Within functional limits for tasks assessed     SENSATION: Does  not endorse any sensory complaints    POSTURE: lateral shift towards L, posterior lean   LUMBAR ROM:   AROM eval  Flexion Knee, *  Extension 75%   Right lateral flexion   Left lateral flexion   Right rotation 75% *  Left rotation 75% *    (Blank rows = not tested) (Key: WFL = within functional limits not formally assessed, * = concordant pain, s = stiffness/stretching sensation, NT = not tested) Comment: second attempt at flexion with mild improvement after repeated ext x5  LOWER EXTREMITY MMT:    MMT Right eval Left eval  Hip flexion 4- 4  Hip abduction (modified sitting)    Hip internal rotation    Hip external rotation    Knee flexion 4- * 4+  Knee extension 4- * 4+  Ankle dorsiflexion 4+ 4+   (Blank rows = not tested) (Key: WFL = within functional limits not formally assessed, * = concordant pain, s = stiffness/stretching sensation, NT = not tested)  Comments:    LUMBAR SPECIAL TESTS:  Mild improvement in flexion tolerance following repeated ext  04/06/24: Slump test: Positive BIL (Pain on right when performed on Left)  FUNCTIONAL TESTS:  5xSTS: 34.14sec UE support from thighs  GAIT: Distance walked: within clinic Assistive device utilized: None Level of assistance: Complete Independence Comments: mildly reduced stance time RLE, reduced truncal rotation and arm swing  TREATMENT DATE:   [facility] Adult PT Treatment:                                                DATE: 05/05/2024  Therapeutic Exercise: NuStep level 4 x 5 min at start of session concurrent with subject assessment  DTKC w/ pball x 20 cues for gentle rocking and comfortable ROM Supine LTR with pball x 20 ea  90/90 hold, 20 sec x 5 90/90 march x 15 cues for form  Standing green band hip abd 3 x 10 each BIL  Standing triple flex/ext within comfortable ROM x 15 ea  Neuromuscular re-ed: PPT + Bridge, 2 x 10   Hooklying knee fallouts 2 x 10 each RTB  Standing red band paloff 2 x 10 each side   cues for posture  [facility] Adult PT Treatment:                                                DATE: 05/03/2024  Therapeutic Exercise: NuStep level 3 x 5 min at start of session concurrent with subject assessment  DTKC w/ pball x 15 cues for gentle rocking and comfortable ROM Supine LTR with pball x 15 ea  90/90 hold, 20 sec x 5 90/90 march 2 x 5 cues for form  Standing green band hip abd x 10 each BIL  Standing triple flex/ext within comfortable ROM x 15 ea  Neuromuscular re-ed: PPT + Bridge, 2 x 10   Hooklying knee fallouts 2 x 10 each RTB  Standing red band paloff x15 cues for posture   [facility] Adult PT Treatment:                                                DATE: 04/29/2024   Therapeutic Exercise: DTKC w/ pball 2x10 cues for gentle rocking and comfortable ROM Supine LTR with pball  90/90 hold, 5 sec x 5 90/90 march -- begin nxt visit d/t recent ultrasound  Standing red band hip abd 3x8 BIL cues for posture  Standing triple flex/ext within comfortable ROM 2x10 BIL -- held today d/t recent ultrasound  NuStep level 2 x 5 min at end of session  Neuromuscular re-ed: PPT + Bridge, 2 x 10   Hooklying knee fallouts 2 x 10 each RTB  Standing red band paloff 2x10 cues for posture                                                                                                   PATIENT EDUCATION:  Education details: rationale for interventions, HEP  Person educated: Patient Education method: Explanation, Demonstration, Tactile cues, Verbal cues Education comprehension: verbalized understanding, returned demonstration, verbal cues required, tactile cues required, and needs further education     HOME EXERCISE PROGRAM: Access Code: [email or web address] URL: [email or web address] Date: 04/06/2024 Prepared by: Corean Pouch  Exercises - Seated Slump Nerve Glide  - 1 x daily - 7 x weekly - 2 sets - 10 reps (instructed in way performed in the clinic with heel supported and  moving through PF/DF in tolerable range) - Supine Posterior Pelvic Tilt  - 1 x daily - 7 x weekly - 2 sets - 10 reps  ASSESSMENT:  CLINICAL IMPRESSION: 05/05/2024: Hannah [name] had good tolerance of today's treatment session, which focused on increased volume of core and hip strengthening activities. She required less frequent rest breaks between exercises. She continues to demonstrate improved mm endurance, and denies exacerbation of back  pain and radicular symptoms with increased activities today. We will continue to progress per POC as tolerated, in order to reach established rehab goals.    EVAL: Patient is a pleasant 38 y.o. woman who was seen today for physical therapy evaluation and treatment for back pain ongoing over past few months. Increased time is required for exam/subjective given initial difficulties w/ video interpreting services. 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 Does not endorse any red flags. On exam she demonstrates good ROM of lumbar spine overall, although it is painful particularly into flexion. Flexion tolerance is modestly improved after repeated extension. LE strength testing is limited by pain. 5xSTS indicative of reduced mobility and fall risk. No adverse events, tolerates session well overall but does have fairly consistent pain levels throughout. Recommend trial of skilled PT to address aforementioned deficits with aim of improving functional tolerance and reducing pain with typical activities. Pt departs today's session in no acute distress, all voiced concerns/questions addressed appropriately from PT perspective.    OBJECTIVE IMPAIRMENTS: decreased activity tolerance, decreased endurance, decreased mobility, difficulty walking, decreased ROM, decreased strength, impaired perceived functional ability, improper body mechanics, postural dysfunction, and pain.   ACTIVITY LIMITATIONS:  carrying, lifting, bending, sitting, standing, squatting, sleeping, stairs, transfers, locomotion level, and caring for others  PARTICIPATION LIMITATIONS: meal prep, cleaning, laundry, community activity, and occupation  PERSONAL FACTORS: Time since onset of injury/illness/exacerbation and 3+ comorbidities: hx TB, insomnia, depression, headache are also affecting patient's functional outcome.   REHAB POTENTIAL: Good  CLINICAL DECISION MAKING: Evolving/moderate complexity  EVALUATION COMPLEXITY: Moderate   GOALS:   SHORT TERM GOALS: Target date: 04/20/2024  Pt will demonstrate appropriate understanding and performance of initially prescribed HEP in order to facilitate improved independence with management of symptoms.  Baseline: HEP TBD  04/19/24: reports good HEP adherence Goal status: MET  2. Pt will report at least 25% improvement in overall pain levels over past week in order to facilitate improved tolerance to typical daily activities.   Baseline: 8/10 on eval 04/19/24: still 7-8/10 on average although frequency of back pain reportedly improved Goal status: ONGOING  LONG TERM GOALS: Target date: 05/11/2024   Pt will improve at least 20% on ODI in order to demonstrate improved perception of functional status due to symptoms.  Baseline: 32/50, 64% Goal status: INITIAL  2.  Pt will demonstrate lumbar flexion AROM to at least distal shin with less than 3 pt increase in pain in order to demonstrate improved tolerance to functional movement patterns.   Baseline: see ROM chart above Goal status: INITIAL  3.  Pt will demonstrate bilateral hip/knee MMT of at least 4+/5 in order to demonstrate improved strength for functional movements.  Baseline: see MMT chart above Goal status: INITIAL  4. Pt will perform 5xSTS in <18 sec in order to demonstrate reduced fall risk and improved functional independence. (MCID of 2.3sec)  Baseline: 34sec UE support  Goal status: INITIAL   5. Pt will  be able to tolerate sitting for >45 min in order to improve driving tolerance.  Baseline: pain >79min  Goal status: INITIAL  PLAN:  PT FREQUENCY: 2x/week  PT DURATION: 6 weeks  PLANNED INTERVENTIONS: 97164- PT Re-evaluation, 97750- Physical Performance Testing, 97110-Therapeutic exercises, 97530- Therapeutic activity, [id number]- Neuromuscular re-education, 97535- Self Care, 02859- Manual therapy, [phone]- Gait training, [phone]- Aquatic Therapy, [phone] (1-2 muscles), [id number] (3+ muscles)- Dry Needling, Patient/Family education, Balance training, Stair training, Taping, Joint mobilization,  Joint manipulation, Spinal mobilization, Cryotherapy, and Moist heat.  PLAN FOR NEXT SESSION: review/update HEP PRN. LE/core stability training. Symptom modification strategies as indicated/appropriate.    Marko Molt, PT, DPT  05/05/2024 9:54 AM

## 2024-05-19 ENCOUNTER — Ambulatory Visit: Payer: Self-pay

## 2024-07-18 ENCOUNTER — Encounter: Payer: Self-pay | Admitting: Radiology

## 2024-09-01 ENCOUNTER — Ambulatory Visit (INDEPENDENT_AMBULATORY_CARE_PROVIDER_SITE_OTHER): Admitting: Obstetrics and Gynecology

## 2024-09-01 ENCOUNTER — Other Ambulatory Visit: Payer: Self-pay

## 2024-09-01 ENCOUNTER — Encounter: Payer: Self-pay | Admitting: Obstetrics and Gynecology

## 2024-09-01 VITALS — BP 139/90 | HR 73 | Wt 201.0 lb

## 2024-09-01 DIAGNOSIS — N939 Abnormal uterine and vaginal bleeding, unspecified: Secondary | ICD-10-CM | POA: Diagnosis not present

## 2024-09-01 DIAGNOSIS — G8929 Other chronic pain: Secondary | ICD-10-CM

## 2024-09-01 DIAGNOSIS — N941 Unspecified dyspareunia: Secondary | ICD-10-CM

## 2024-09-01 DIAGNOSIS — N946 Dysmenorrhea, unspecified: Secondary | ICD-10-CM

## 2024-09-01 DIAGNOSIS — R102 Pelvic and perineal pain unspecified side: Secondary | ICD-10-CM

## 2024-09-01 NOTE — Progress Notes (Signed)
 NEW GYNECOLOGY PATIENT Patient name: Hannah [name] MRN [id number]  Date of birth: [birth year] Chief Complaint:   Dysmenorrhea  History:  Hannah [name] here for heavy, painful period.   Painful periods for 1 year, periods got heavier, using diapers and changes 2-3x/day and lasts 2 weeks, but scond eek can use just a pd and changing once daily. Cramps. Drinks herbal tea which helps. Discomfort b/n period and feels lumps on skin an dlump in vagina. Not happy w/ previous provider and looking for answer. Last pap last year in [place]. Has pain with intercourse. Had been having severe back pain as well that radiated down right leg for which she underwent PT for.      Gynecologic History Patient's last menstrual period was 08/07/2024 (exact date). Contraception: tubal ligation   OB History  Gravida Para Term Preterm AB Living  4 4 4  0 0 4  SAB IAB Ectopic Multiple Live Births  0 0 0 0 3    # Outcome Date GA Lbr Len/2nd Weight Sex Type Anes PTL Lv  4 Term 08/18/14 [redacted]w[redacted]d 13:08 / 00:10 8 lb 7.1 oz (3.83 kg) M Vag-Spont None  LIV  3 Term 11/02/08    F Vag-Spont   LIV  2 Term 01/03/06     Vag-Spont   LIV  1 Term 02/18/04    M Vag-Spont        The following portions of the patient's history were reviewed and updated as appropriate: allergies, current medications, past family history, past medical history, past social history, past surgical history and problem list. Health Maintenance  Topic Date Due   Hepatitis C Screening  Never done   Hepatitis B Vaccine (1 of 3 - 19+ 3-dose series) Never done   HPV Vaccine (1 - 3-dose SCDM series) Never done   Pap with HPV screening  01/26/2022   Flu Shot  Never done   COVID-19 Vaccine (1 - 2025-26 season) Never done   Breast Cancer Screening  08/16/2024   DTaP/Tdap/Td vaccine (2 - Td or Tdap) 11/10/2026   HIV Screening  Completed   Pneumococcal Vaccine  Aged Out   Meningitis B Vaccine  Aged Out    Review of Systems Pertinent  items noted in HPI and remainder of comprehensive ROS otherwise negative.  Physical Exam:  BP (!) 141/93   Pulse 82   Wt 201 lb (91.2 kg)   LMP 08/07/2024 (Exact Date)   BMI 32.44 kg/m  Physical Exam Vitals and nursing note reviewed. Exam conducted with a chaperone present.  Constitutional:      Appearance: Normal appearance.  Pulmonary:     Effort: Pulmonary effort is normal.  Abdominal:     Palpations: Abdomen is soft.     Comments: + [place], right > left  Genitourinary:    General: Normal vulva.     Exam position: Lithotomy position.     Comments: Normal appearing vulva Normal vulvar sensation bilaterally Tender left superficial muscle; nontender right side Nontender ischial tuberosities bilaterally  Allodynia at introitus: Yes: 6-9 o'clock  Anal wink not present Posterior vaginal wall tender Right levator ani 7/10 Right ischiococcygeous 8/10 Right obturator internus 8/10 Left levator ani 8/10 Left ischioccocygeous 8/10 Left obturator internus 8/10 Anterior vaginal wall tender Uterus tender   Neurological:     Mental Status: She is alert.      Assessment and Plan:   1. Abnormal uterine bleeding (AUB) (Primary) Initial lab work up regarding AUB. Reviewed US  demonstrating fibroid present  and appears to be submucosal and likely contributor to bleeding. Discussed option of medication management now but would prefer to see result of labs prior to initiating treatment. Briefly reviewed medical and surgical options for fibroid and bleeding in general.  - CBC - Prolactin - TSH Rfx on Abnormal to Free T4 - Follicle stimulating hormone - Hemoglobin A1c  2. Chronic pelvic pain in female 3. Dyspareunia in female Recommend PFPT to address pelvic myalgia.   4. Dysmenorrhea    Follow-up: No follow-ups on file.      Carter Quarry, MD Obstetrician & Gynecologist, Faculty Practice [facility] [employer], [facility]

## 2024-09-02 ENCOUNTER — Ambulatory Visit: Payer: Self-pay | Admitting: Obstetrics and Gynecology

## 2024-09-02 LAB — CBC
Hematocrit: 41.9 % (ref 34.0–46.6)
Hemoglobin: 13.4 g/dL (ref 11.1–15.9)
MCH: 28.3 pg (ref 26.6–33.0)
MCHC: 32 g/dL (ref 31.5–35.7)
MCV: 89 fL (ref 79–97)
Platelets: 328 x10E3/uL (ref 150–450)
RBC: 4.73 x10E6/uL (ref 3.77–5.28)
RDW: 13.2 % (ref 11.7–15.4)
WBC: 7.2 x10E3/uL (ref 3.4–10.8)

## 2024-09-02 LAB — TSH RFX ON ABNORMAL TO FREE T4: TSH: 0.572 u[IU]/mL (ref 0.450–4.500)

## 2024-09-02 LAB — PROLACTIN: Prolactin: 13.6 ng/mL (ref 4.8–33.4)

## 2024-09-02 LAB — HEMOGLOBIN A1C
Est. average glucose Bld gHb Est-mCnc: 117 mg/dL
Hgb A1c MFr Bld: 5.7 % — ABNORMAL HIGH (ref 4.8–5.6)

## 2024-09-02 LAB — FOLLICLE STIMULATING HORMONE: FSH: 2 m[IU]/mL

## 2024-09-05 NOTE — Telephone Encounter (Addendum)
 Attempted to call pt with [facility] [phone], left voicemail with office call back number.   Waddell, RN   ----- Message from Carter Quarry, MD sent at 09/02/2024  1:13 PM EST ----- Notify labs normal with A1c slightly elevated to pre-diabetes level

## 2024-09-06 NOTE — Telephone Encounter (Signed)
 Called pt with [facility] [phone], advised pt of lab results.  Discussed improving A1C with diet changes and exercise, follow up with PCP.  She verbalized understanding and had no further questions.   Waddell, RN

## 2024-10-31 ENCOUNTER — Ambulatory Visit: Admitting: Physical Therapy
